# Patient Record
Sex: Female | Born: 1975 | Race: White | Hispanic: No | Marital: Married | Smoking: Former smoker
Health system: Southern US, Community
[De-identification: ages and names within clinical notes are randomized; demographics above are authoritative.]

## PROBLEM LIST (undated history)

## (undated) DIAGNOSIS — Z9889 Other specified postprocedural states: Secondary | ICD-10-CM

## (undated) DIAGNOSIS — C801 Malignant (primary) neoplasm, unspecified: Secondary | ICD-10-CM

## (undated) DIAGNOSIS — T8859XA Other complications of anesthesia, initial encounter: Secondary | ICD-10-CM

## (undated) DIAGNOSIS — K219 Gastro-esophageal reflux disease without esophagitis: Secondary | ICD-10-CM

## (undated) DIAGNOSIS — D649 Anemia, unspecified: Secondary | ICD-10-CM

## (undated) DIAGNOSIS — T4145XA Adverse effect of unspecified anesthetic, initial encounter: Secondary | ICD-10-CM

## (undated) DIAGNOSIS — J45909 Unspecified asthma, uncomplicated: Secondary | ICD-10-CM

## (undated) DIAGNOSIS — R112 Nausea with vomiting, unspecified: Secondary | ICD-10-CM

## (undated) DIAGNOSIS — Z5189 Encounter for other specified aftercare: Secondary | ICD-10-CM

## (undated) HISTORY — PX: ABDOMINAL HYSTERECTOMY: SHX81

## (undated) HISTORY — DX: Anemia, unspecified: D64.9

## (undated) HISTORY — DX: Encounter for other specified aftercare: Z51.89

## (undated) HISTORY — DX: Gastro-esophageal reflux disease without esophagitis: K21.9

## (undated) HISTORY — DX: Unspecified asthma, uncomplicated: J45.909

## (undated) HISTORY — DX: Malignant (primary) neoplasm, unspecified: C80.1

## (undated) HISTORY — PX: SIGMOIDOSCOPY: SUR1295

---

## 2011-01-17 ENCOUNTER — Encounter: Payer: Self-pay | Admitting: *Deleted

## 2011-01-17 ENCOUNTER — Emergency Department
Admission: EM | Admit: 2011-01-17 | Discharge: 2011-01-17 | Disposition: A | Discharge status: Home / Self Care | Payer: BC Managed Care – PPO | Source: Home / Self Care | Attending: Family Medicine | Admitting: Family Medicine

## 2011-01-17 DIAGNOSIS — J069 Acute upper respiratory infection, unspecified: Secondary | ICD-10-CM

## 2011-01-17 DIAGNOSIS — M940 Chondrocostal junction syndrome [Tietze]: Secondary | ICD-10-CM

## 2011-01-17 MED ORDER — PREDNISONE 10 MG PO TABS: ORAL_TABLET | ORAL | Status: AC

## 2011-01-17 MED ORDER — ALBUTEROL SULFATE HFA 108 (90 BASE) MCG/ACT IN AERS: 2.0000 | INHALATION_SPRAY | RESPIRATORY_TRACT | Status: AC | PRN

## 2011-01-17 MED ORDER — BENZONATATE 200 MG PO CAPS: 200.0000 mg | ORAL_CAPSULE | Freq: Every day | ORAL | Status: AC

## 2011-01-17 MED ORDER — AZITHROMYCIN 250 MG PO TABS: ORAL_TABLET | ORAL | Status: AC

## 2011-01-17 NOTE — ED Notes (Signed)
Patient c/o loss of voice about 5 days ago. Cough (productive at times), congestion and drainage started shortly after. Denies fever. Used tylenol and afrin OTC.

## 2011-01-19 NOTE — ED Provider Notes (Signed)
History     CSN: [id number] Arrival date & time: 01/17/2011 10:50 AM   First MD Initiated Contact with Patient 01/17/11 1122      Chief Complaint  Patient presents with  . Cough  . URI     HPI Comments: Patient complains of approximately 6 day history of gradually progressive URI symptoms beginning with a mild sore throat (now improved) and hoarseness, followed by progressive nasal congestion.  A cough started about 2 days ago.  Complains of fatigue and initial myalgias.  Cough is now worse at night and semi-productive during the day.  There has been no pleuritic pain or shortness of breath, but she wheezes at times. She has a past history of exercise asthma.  Patient is a 35 y.o. female presenting with cough and URI. The history is provided by the patient.  Cough This is a new problem. The current episode started more than 1 week ago. The problem occurs every few minutes. The problem has been gradually worsening. The cough is productive of sputum. There has been no fever. Associated symptoms include headaches, rhinorrhea, sore throat and wheezing. Pertinent negatives include no chest pain, no chills, no sweats, no ear congestion, no ear pain, no myalgias, no shortness of breath and no eye redness. She has tried decongestants for the symptoms. The treatment provided no relief. She is not a smoker. Her past medical history is significant for asthma. Her past medical history does not include pneumonia.  URI The primary symptoms include fatigue, headaches, sore throat, cough and wheezing. Primary symptoms do not include fever, ear pain or myalgias.  The patient's medical history is significant for asthma.  Symptoms associated with the illness include congestion and rhinorrhea. The illness is not associated with chills or sinus pressure.    History reviewed. No pertinent past medical history.  Past Surgical History  Procedure Date  . Cesarean section   . Abdominal hysterectomy      History reviewed. No pertinent family history.  History  Substance Use Topics  . Smoking status: Former Smoker    Quit date: 01/17/2008  . Smokeless tobacco: Not on file  . Alcohol Use: Yes     5/week    OB History    Grav Para Term Preterm Abortions TAB SAB Ect Mult Living                  Review of Systems  Constitutional: Positive for fatigue. Negative for fever and chills.  HENT: Positive for congestion, sore throat and rhinorrhea. Negative for ear pain and sinus pressure.   Eyes: Negative.  Negative for redness.  Respiratory: Positive for cough, chest tightness and wheezing. Negative for shortness of breath.   Cardiovascular: Negative for chest pain.  Gastrointestinal: Negative.   Genitourinary: Negative.   Musculoskeletal: Negative.  Negative for myalgias.  Skin: Negative.   Neurological: Positive for headaches.    Allergies  Penicillins and Sulfa antibiotics  Home Medications   Current Outpatient Rx  Name Route Sig Dispense Refill  . ALBUTEROL SULFATE HFA 108 (90 BASE) MCG/ACT IN AERS Inhalation Inhale 2 puffs into the lungs every 4 (four) hours as needed for wheezing. 1 Inhaler 0  . AZITHROMYCIN 250 MG PO TABS  Take 2 tabs today; then begin one tab once daily for 4 more days. (Rx void after 01/24/11)   DEA # [id number] each 0  . BENZONATATE 200 MG PO CAPS Oral Take 1 capsule (200 mg total) by mouth at bedtime. Take as needed for  cough 12 capsule 0  . PREDNISONE 10 MG PO TABS  Take 2 tabs twice daily for two days, then one tab twice daily for 2 days, then 1 daily for two days.  Take PC 14 tablet 0    BP 96/60  Pulse 69  Temp(Src) 98.5 F (36.9 C) (Oral)  Resp 14  Ht 5\' 3"  (1.6 m)  Wt 130 lb (58.968 kg)  BMI 23.03 kg/m2  SpO2 99%  Physical Exam  Nursing note and vitals reviewed. Constitutional: She is oriented to person, place, and time. She appears well-developed and well-nourished. No distress.  HENT:  Head: Normocephalic.  Right Ear: External  ear normal.  Left Ear: External ear normal.  Nose: Mucosal edema and rhinorrhea present. No sinus tenderness.  Mouth/Throat: Oropharynx is clear and moist. No oropharyngeal exudate.  Eyes: Conjunctivae and EOM are normal. Pupils are equal, round, and reactive to light. Right eye exhibits no discharge. Left eye exhibits no discharge.  Neck: Normal range of motion. Neck supple.  Cardiovascular: Normal rate, regular rhythm and normal heart sounds.   Pulmonary/Chest: Effort normal and breath sounds normal. She exhibits tenderness.       Distinct tenderness to palpation over sternum  Abdominal: Soft. Bowel sounds are normal. There is no tenderness.  Musculoskeletal: She exhibits no edema.  Lymphadenopathy:    She has cervical adenopathy.       Right cervical: Posterior cervical adenopathy present.       Left cervical: Posterior cervical adenopathy present.  Neurological: She is alert and oriented to person, place, and time.  Skin: Skin is warm and dry. She is not diaphoretic.    ED Course  Procedures none      1. Acute upper respiratory infections of unspecified site   2. Costochondritis, acute       MDM  There is no evidence of bacterial infection today.   Begin tapering course of prednisone.  Rx for albuterol inhaler as needed. Take Mucinex D (guaifenesin with decongestant) twice daily for congestion.  Increase fluid intake, rest. May use Afrin nasal spray (or generic oxymetazoline) twice daily for about 5 days.  Also recommend using saline nasal spray several times daily and/or saline nasal irrigation. Stop all antihistamines for now, and other non-prescription cough/cold preparations. May take Ibuprofen 200mg , 4 tabs every 8 hours with food for chest/sternum discomfort. Begin Azithromycin if not improving about 5 days or if persistent fever develops. Recommend flu shot when well Follow-up with family doctor if not improving 7 to 10 days.       Donna Christen,  MD 01/19/11 [phone]

## 2012-02-20 DIAGNOSIS — C801 Malignant (primary) neoplasm, unspecified: Secondary | ICD-10-CM

## 2012-02-20 HISTORY — DX: Malignant (primary) neoplasm, unspecified: C80.1

## 2012-02-20 HISTORY — PX: OTHER SURGICAL HISTORY: SHX169

## 2014-11-12 ENCOUNTER — Other Ambulatory Visit: Payer: Self-pay

## 2014-11-12 DIAGNOSIS — Z1231 Encounter for screening mammogram for malignant neoplasm of breast: Secondary | ICD-10-CM

## 2014-11-23 ENCOUNTER — Ambulatory Visit
Admission: RE | Admit: 2014-11-23 | Discharge: 2014-11-23 | Disposition: A | Discharge status: Home / Self Care | Payer: BLUE CROSS/BLUE SHIELD | Source: Ambulatory Visit

## 2014-11-23 DIAGNOSIS — Z1231 Encounter for screening mammogram for malignant neoplasm of breast: Secondary | ICD-10-CM

## 2014-11-25 ENCOUNTER — Other Ambulatory Visit: Payer: Self-pay | Admitting: Obstetrics and Gynecology

## 2014-11-25 DIAGNOSIS — R928 Other abnormal and inconclusive findings on diagnostic imaging of breast: Secondary | ICD-10-CM

## 2014-12-02 ENCOUNTER — Ambulatory Visit
Admission: RE | Admit: 2014-12-02 | Discharge: 2014-12-02 | Disposition: A | Discharge status: Home / Self Care | Payer: BLUE CROSS/BLUE SHIELD | Source: Ambulatory Visit | Attending: Obstetrics and Gynecology | Admitting: Obstetrics and Gynecology

## 2014-12-02 DIAGNOSIS — R928 Other abnormal and inconclusive findings on diagnostic imaging of breast: Secondary | ICD-10-CM

## 2015-07-21 DIAGNOSIS — K219 Gastro-esophageal reflux disease without esophagitis: Secondary | ICD-10-CM | POA: Diagnosis not present

## 2015-07-21 DIAGNOSIS — D539 Nutritional anemia, unspecified: Secondary | ICD-10-CM | POA: Diagnosis not present

## 2015-07-21 DIAGNOSIS — Z6824 Body mass index (BMI) 24.0-24.9, adult: Secondary | ICD-10-CM | POA: Diagnosis not present

## 2015-07-21 DIAGNOSIS — J45909 Unspecified asthma, uncomplicated: Secondary | ICD-10-CM | POA: Diagnosis not present

## 2015-10-11 DIAGNOSIS — Z6824 Body mass index (BMI) 24.0-24.9, adult: Secondary | ICD-10-CM | POA: Diagnosis not present

## 2015-10-11 DIAGNOSIS — B372 Candidiasis of skin and nail: Secondary | ICD-10-CM | POA: Diagnosis not present

## 2015-10-11 DIAGNOSIS — Z23 Encounter for immunization: Secondary | ICD-10-CM | POA: Diagnosis not present

## 2015-11-03 DIAGNOSIS — N898 Other specified noninflammatory disorders of vagina: Secondary | ICD-10-CM | POA: Diagnosis not present

## 2015-11-03 DIAGNOSIS — Z8544 Personal history of malignant neoplasm of other female genital organs: Secondary | ICD-10-CM | POA: Diagnosis not present

## 2015-11-03 DIAGNOSIS — C52 Malignant neoplasm of vagina: Secondary | ICD-10-CM | POA: Diagnosis not present

## 2015-11-03 DIAGNOSIS — Z87891 Personal history of nicotine dependence: Secondary | ICD-10-CM | POA: Diagnosis not present

## 2015-11-03 DIAGNOSIS — N959 Unspecified menopausal and perimenopausal disorder: Secondary | ICD-10-CM | POA: Diagnosis not present

## 2015-11-03 DIAGNOSIS — Z08 Encounter for follow-up examination after completed treatment for malignant neoplasm: Secondary | ICD-10-CM | POA: Diagnosis not present

## 2015-12-22 ENCOUNTER — Other Ambulatory Visit: Payer: Self-pay | Admitting: Internal Medicine

## 2015-12-22 DIAGNOSIS — Z1231 Encounter for screening mammogram for malignant neoplasm of breast: Secondary | ICD-10-CM

## 2016-01-24 ENCOUNTER — Ambulatory Visit
Admission: RE | Admit: 2016-01-24 | Discharge: 2016-01-24 | Disposition: A | Discharge status: Home / Self Care | Payer: BLUE CROSS/BLUE SHIELD | Source: Ambulatory Visit | Attending: Internal Medicine | Admitting: Internal Medicine

## 2016-01-24 DIAGNOSIS — Z1231 Encounter for screening mammogram for malignant neoplasm of breast: Secondary | ICD-10-CM | POA: Diagnosis not present

## 2016-01-30 DIAGNOSIS — R8299 Other abnormal findings in urine: Secondary | ICD-10-CM | POA: Diagnosis not present

## 2016-01-30 DIAGNOSIS — D538 Other specified nutritional anemias: Secondary | ICD-10-CM | POA: Diagnosis not present

## 2016-01-30 DIAGNOSIS — Z0000 Encounter for general adult medical examination without abnormal findings: Secondary | ICD-10-CM | POA: Diagnosis not present

## 2016-02-07 DIAGNOSIS — D538 Other specified nutritional anemias: Secondary | ICD-10-CM | POA: Diagnosis not present

## 2016-02-07 DIAGNOSIS — Z7189 Other specified counseling: Secondary | ICD-10-CM | POA: Diagnosis not present

## 2016-02-07 DIAGNOSIS — Z0000 Encounter for general adult medical examination without abnormal findings: Secondary | ICD-10-CM | POA: Diagnosis not present

## 2016-02-07 DIAGNOSIS — Z23 Encounter for immunization: Secondary | ICD-10-CM | POA: Diagnosis not present

## 2016-02-07 DIAGNOSIS — Z1389 Encounter for screening for other disorder: Secondary | ICD-10-CM | POA: Diagnosis not present

## 2016-02-07 DIAGNOSIS — J45909 Unspecified asthma, uncomplicated: Secondary | ICD-10-CM | POA: Diagnosis not present

## 2016-02-07 DIAGNOSIS — K219 Gastro-esophageal reflux disease without esophagitis: Secondary | ICD-10-CM | POA: Diagnosis not present

## 2016-02-08 DIAGNOSIS — D538 Other specified nutritional anemias: Secondary | ICD-10-CM | POA: Diagnosis not present

## 2016-03-16 DIAGNOSIS — J101 Influenza due to other identified influenza virus with other respiratory manifestations: Secondary | ICD-10-CM | POA: Diagnosis not present

## 2016-04-09 ENCOUNTER — Encounter: Payer: Self-pay | Admitting: Gastroenterology

## 2016-05-10 DIAGNOSIS — Z87891 Personal history of nicotine dependence: Secondary | ICD-10-CM | POA: Diagnosis not present

## 2016-05-10 DIAGNOSIS — Z90710 Acquired absence of both cervix and uterus: Secondary | ICD-10-CM | POA: Diagnosis not present

## 2016-05-10 DIAGNOSIS — Z08 Encounter for follow-up examination after completed treatment for malignant neoplasm: Secondary | ICD-10-CM | POA: Diagnosis not present

## 2016-05-10 DIAGNOSIS — C52 Malignant neoplasm of vagina: Secondary | ICD-10-CM | POA: Diagnosis not present

## 2016-05-10 DIAGNOSIS — Z8544 Personal history of malignant neoplasm of other female genital organs: Secondary | ICD-10-CM | POA: Diagnosis not present

## 2016-05-14 ENCOUNTER — Ambulatory Visit (INDEPENDENT_AMBULATORY_CARE_PROVIDER_SITE_OTHER): Payer: BLUE CROSS/BLUE SHIELD | Admitting: Gastroenterology

## 2016-05-14 ENCOUNTER — Encounter (INDEPENDENT_AMBULATORY_CARE_PROVIDER_SITE_OTHER): Payer: Self-pay

## 2016-05-14 ENCOUNTER — Encounter: Payer: Self-pay | Admitting: Gastroenterology

## 2016-05-14 VITALS — BP 100/66 | HR 76 | Ht 63.0 in | Wt 137.2 lb

## 2016-05-14 DIAGNOSIS — K625 Hemorrhage of anus and rectum: Secondary | ICD-10-CM | POA: Diagnosis not present

## 2016-05-14 NOTE — Progress Notes (Signed)
HPI: This is a  very pleasant 41 year old woman  who was referred to me by Marton Redwood, MD  to evaluate  rectal bleeding, lower abdominal cramping .    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  Radiation and chemo.  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 See below.  Has also seen Dr. Michail Sermon at [place].  Things were were getting better for past 2  Years.  Then bleeding, cramping, bloating.  Avoids dairy.  For about 2 months bleeding and cramping.  Cramping is several days per week.  Bleeding a few days per week.  Does not  have to push or strain at all to move her bowels. She does have a lot of bloating at times. She's had no fevers or chills. No significant abdominal pains at all. Her weight has been overall stable  Takes one MVI once daily.    Old Data Reviewed: Cut/Pasted from [facility] GI note 2015: Had radiation therapy in January 2015 Onset of symptoms was Sept 2015. Most bothered by tenesmus. Denies straining, need for splinting, or prolapse. Previously had nightime symptoms-not now. Pain is in rectum, intense dull ache that lingers after bowel movements, lingers for hours afterward. Then self-limited. Tramadol has helped in the past. Motrin also helped, but she stopped this due to proctitis. Flex sig in 10/15 [facility] rectal mucosal ulceration, biopsies benign. Used canasa-no relief after 10 days. Also used cortenemas Also without relief. Colonoscopy 12/15 [facility] showed no change in mucosal abnormalities.   Review of systems: Pertinent positive and negative review of systems were noted in the above HPI section. Complete review of systems was performed and was otherwise normal.   History reviewed. No pertinent past medical history.  Past Surgical History:  Procedure Laterality Date  . ABDOMINAL HYSTERECTOMY    . CESAREAN  SECTION      Current Outpatient Prescriptions  Medication Sig Dispense Refill  . Multiple Vitamin (MULTI-VITAMINS) TABS Take by mouth.    . pantoprazole (PROTONIX) 40 MG tablet Take 1 tablet by mouth daily.  6  . PREMARIN 0.625 MG tablet Take 1 tablet by mouth daily.  3  . albuterol (PROVENTIL HFA;VENTOLIN HFA) 108 (90 BASE) MCG/ACT inhaler Inhale 2 puffs into the lungs every 4 (four) hours as needed for wheezing. 1 Inhaler 0   No current facility-administered medications for this visit.     Allergies as of 05/14/2016 - Review Complete 05/14/2016  Allergen Reaction Noted  . Penicillins  01/17/2011  . Sulfa antibiotics  01/17/2011    History reviewed. No pertinent family history.  Social History   Social History  . Marital status: Married    Spouse name: N/A  . Number of children: 3  . Years of education: N/A   Occupational History  . Not on file.   Social History Main Topics  . Smoking status: Former Smoker    Quit date: 01/17/2008  . Smokeless tobacco: Never Used  . Alcohol use Yes     Comment: 5/week  . Drug use: No  . Sexual activity: Not on file   Other Topics Concern  . Not on file   Social History Narrative  . No narrative on file     Physical Exam: BP 100/66   Pulse 76   Ht 5\' 3"  (1.6 m)   Wt 137 lb 3.2 oz (62.2 kg)   BMI 24.30  kg/m  Constitutional: generally well-appearing Psychiatric: alert and oriented x3 Eyes: extraocular movements intact Mouth: oral pharynx moist, no lesions Neck: supple no lymphadenopathy Cardiovascular: heart regular rate and rhythm Lungs: clear to auscultation bilaterally Abdomen: soft, nontender, nondistended, no obvious ascites, no peritoneal signs, normal bowel sounds Extremities: no lower extremity edema bilaterally Skin: no lesions on visible extremities   Assessment and plan: 41 y.o. 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 I recommended we  try to prove chronic radiation damage with flexible sigmoidoscopy at her soonest convenience. Management if she does have radiation related proctitis is symptomatic control. She is really not very bothered by her symptoms and she said if we exclude other potential causes and she probably would opt for no treatment at this point. I think it would just be fine and we'll proceed with flexible sigmoidoscopy.    Please see the "Patient Instructions" section for addition details about the plan.   Owens Loffler, MD [facility] 05/14/2016, 8:36 AM  Cc: Marton Redwood, MD

## 2016-05-14 NOTE — Patient Instructions (Addendum)
Flexible sigmoidoscopy for rectal bleeding, cramping; restaging of previously known radiation proctitis.

## 2016-06-09 DIAGNOSIS — M545 Low back pain: Secondary | ICD-10-CM | POA: Diagnosis not present

## 2016-06-09 DIAGNOSIS — J069 Acute upper respiratory infection, unspecified: Secondary | ICD-10-CM | POA: Diagnosis not present

## 2016-06-09 DIAGNOSIS — R300 Dysuria: Secondary | ICD-10-CM | POA: Diagnosis not present

## 2016-06-27 ENCOUNTER — Encounter: Payer: Self-pay | Admitting: Gastroenterology

## 2016-07-11 ENCOUNTER — Other Ambulatory Visit: Payer: Self-pay

## 2016-07-11 ENCOUNTER — Ambulatory Visit (AMBULATORY_SURGERY_CENTER): Payer: BLUE CROSS/BLUE SHIELD | Admitting: Gastroenterology

## 2016-07-11 ENCOUNTER — Encounter: Payer: Self-pay | Admitting: Gastroenterology

## 2016-07-11 ENCOUNTER — Telehealth: Payer: Self-pay

## 2016-07-11 VITALS — BP 90/60 | HR 55 | Temp 98.9°F | Resp 10 | Ht 63.0 in | Wt 137.0 lb

## 2016-07-11 DIAGNOSIS — K627 Radiation proctitis: Secondary | ICD-10-CM

## 2016-07-11 DIAGNOSIS — K625 Hemorrhage of anus and rectum: Secondary | ICD-10-CM

## 2016-07-11 MED ORDER — SODIUM CHLORIDE 0.9 % IV SOLN: 500.0000 mL | INTRAVENOUS | Status: DC

## 2016-07-11 NOTE — Telephone Encounter (Signed)
Pt has been scheduled for 07/19/16 1015 am [facility] Left message on machine to call back

## 2016-07-11 NOTE — Telephone Encounter (Signed)
-----   Message from Milus Banister, MD sent at 07/11/2016  2:01 PM EDT ----- She would like to go ahead with flex sig APC for radiation proctitis at [facility] (next Thursday or the one after).  Thanks  dj

## 2016-07-11 NOTE — Op Note (Signed)
[name] Procedure Date: 07/11/2016 1:40 PM MRN: [id number] Endoscopist: Milus Banister , MD Age: 41 Referring MD:  Date of Birth: 04/28/1975 Gender: Female Account #: [id number] Procedure:                Flexible Sigmoidoscopy Indications:              Hematochezia; h/o pelvic radiation 2015 for vaginal                            cancer Medicines:                Monitored Anesthesia Care Procedure:                Pre-Anesthesia Assessment:                           - Prior to the procedure, a History and Physical                            was performed, and patient medications and                            allergies were reviewed. The patient's tolerance of                            previous anesthesia was also reviewed. The risks                            and benefits of the procedure and the sedation                            options and risks were discussed with the patient.                            All questions were answered, and informed consent                            was obtained. Prior Anticoagulants: The patient has                            taken no previous anticoagulant or antiplatelet                            agents. ASA Grade Assessment: II - A patient with                            mild systemic disease. After reviewing the risks                            and benefits, the patient was deemed in                            satisfactory condition to undergo the procedure.  After obtaining informed consent, the scope was                            passed under direct vision. The Colonoscope was                            introduced through the anus and advanced to the the                            splenic flexure. The flexible sigmoidoscopy was                            accomplished without difficulty. The patient                            tolerated the procedure well. The quality of the                             bowel preparation was good. 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                           The exam was otherwise without abnormality. Complications:            No immediate complications. Estimated blood loss:                            None. Estimated Blood Loss:     Estimated blood loss: none. 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 These are the source of your                            intermittent bleeding and they would be amenable to                            APC (cautery) treatment if you are ever interested. Recommendation:           - Discharge patient to home (ambulatory). Milus Banister, MD 07/11/2016 1:48:55 PM This report has been signed electronically.

## 2016-07-11 NOTE — Progress Notes (Signed)
Report to PACU, RN, vss, BBS= Clear.  

## 2016-07-11 NOTE — Patient Instructions (Addendum)
   YOU HAD AN ENDOSCOPIC PROCEDURE TODAY AT [facility]:   Refer to the procedure report that was given to you for any specific questions about what was found during the examination.  If the procedure report does not answer your questions, please call your gastroenterologist to clarify.  If you requested that your care partner not be given the details of your procedure findings, then the procedure report has been included in a sealed envelope for you to review at your convenience later.  YOU SHOULD EXPECT: Some feelings of bloating in the abdomen. Passage of more gas than usual.  Walking can help get rid of the air that was put into your GI tract during the procedure and reduce the bloating. If you had a lower endoscopy (such as a colonoscopy or flexible sigmoidoscopy) you may notice spotting of blood in your stool or on the toilet paper. If you underwent a bowel prep for your procedure, you may not have a normal bowel movement for a few days.  Please Note:  You might notice some irritation and congestion in your nose or some drainage.  This is from the oxygen used during your procedure.  There is no need for concern and it should clear up in a day or so.  SYMPTOMS TO REPORT IMMEDIATELY:   Following lower endoscopy (colonoscopy or flexible sigmoidoscopy):  Excessive amounts of blood in the stool  Significant tenderness or worsening of abdominal pains  Swelling of the abdomen that is new, acute  Fever of 100F or higher   For urgent or emergent issues, a gastroenterologist can be reached at any hour by calling [phone].   DIET:  We do recommend a small meal at first, but then you may proceed to your regular diet.  Drink plenty of fluids but you should avoid alcoholic beverages for 24 hours.  ACTIVITY:  You should plan to take it easy for the rest of today and you should NOT DRIVE or use heavy machinery until tomorrow (because of the sedation medicines used during the  test).    FOLLOW UP: Our staff will call the number listed on your records the next business day following your procedure to check on you and address any questions or concerns that you may have regarding the information given to you following your procedure. If we do not reach you, we will leave a message.  However, if you are feeling well and you are not experiencing any problems, there is no need to return our call.  We will assume that you have returned to your regular daily activities without incident.  If any biopsies were taken you will be contacted by phone or by letter within the next 1-3 weeks.  Please call us at [phone] if you have not heard about the biopsies in 3 weeks.    SIGNATURES/CONFIDENTIALITY: You and/or your care partner have signed paperwork which will be entered into your electronic medical record.  These signatures attest to the fact that that the information above on your After Visit Summary has been reviewed and is understood.  Full responsibility of the confidentiality of this discharge information lies with you and/or your care-partner.  Read all information given to you by your recovery room nurse.  The CMA from the 3rd floor will call you to schedule your other procedure.

## 2016-07-12 ENCOUNTER — Other Ambulatory Visit: Payer: Self-pay

## 2016-07-12 ENCOUNTER — Telehealth: Payer: Self-pay | Admitting: *Deleted

## 2016-07-12 NOTE — Telephone Encounter (Signed)
Left message on machine to call back  

## 2016-07-12 NOTE — Telephone Encounter (Signed)
Pt request appt to be moved to 08/02/16 730 am New instructions have been sent to the pt via My Chart

## 2016-07-12 NOTE — Telephone Encounter (Signed)
Flex scheduled, pt instructed and medications reviewed.  Patient instructions mailed to home.  Patient to call with any questions or concerns.  

## 2016-07-12 NOTE — Telephone Encounter (Signed)
  Follow up Call-  Call back number 07/11/2016  Post procedure Call Back phone  # [phone]  Permission to leave phone message Yes  Some recent data might be hidden     Patient questions:  Do you have a fever, pain , or abdominal swelling? No. Pain Score  0 *  Have you tolerated food without any problems? Yes.    Have you been able to return to your normal activities? Yes.    Do you have any questions about your discharge instructions: Diet   No. Medications  No. Follow up visit  No.  Do you have questions or concerns about your Care? No.  Actions: * If pain score is 4 or above: No action needed, pain <4.

## 2016-07-26 ENCOUNTER — Telehealth: Payer: Self-pay | Admitting: Gastroenterology

## 2016-07-26 NOTE — Telephone Encounter (Signed)
Patient's flex sig is cancelled with central scheduling.

## 2016-07-27 NOTE — Telephone Encounter (Signed)
Dr Ardis Hughs the pt cancelled the Flex and states she will call back to reschedule

## 2016-08-02 ENCOUNTER — Ambulatory Visit (HOSPITAL_COMMUNITY): Admit: 2016-08-02 | Payer: BLUE CROSS/BLUE SHIELD | Admitting: Gastroenterology

## 2016-08-02 ENCOUNTER — Encounter (HOSPITAL_COMMUNITY): Payer: Self-pay

## 2016-08-02 SURGERY — SIGMOIDOSCOPY, FLEXIBLE
Anesthesia: Moderate Sedation

## 2016-08-13 DIAGNOSIS — Z23 Encounter for immunization: Secondary | ICD-10-CM | POA: Diagnosis not present

## 2016-11-08 DIAGNOSIS — Z23 Encounter for immunization: Secondary | ICD-10-CM | POA: Diagnosis not present

## 2016-11-15 DIAGNOSIS — Z8544 Personal history of malignant neoplasm of other female genital organs: Secondary | ICD-10-CM | POA: Diagnosis not present

## 2016-11-15 DIAGNOSIS — Z86008 Personal history of in-situ neoplasm of other site: Secondary | ICD-10-CM | POA: Diagnosis not present

## 2016-11-15 DIAGNOSIS — Z09 Encounter for follow-up examination after completed treatment for conditions other than malignant neoplasm: Secondary | ICD-10-CM | POA: Diagnosis not present

## 2016-11-15 DIAGNOSIS — N393 Stress incontinence (female) (male): Secondary | ICD-10-CM | POA: Diagnosis not present

## 2016-11-15 DIAGNOSIS — C539 Malignant neoplasm of cervix uteri, unspecified: Secondary | ICD-10-CM | POA: Diagnosis not present

## 2016-11-19 ENCOUNTER — Other Ambulatory Visit: Payer: Self-pay | Admitting: Internal Medicine

## 2016-11-19 DIAGNOSIS — R52 Pain, unspecified: Secondary | ICD-10-CM | POA: Diagnosis not present

## 2016-11-19 DIAGNOSIS — R300 Dysuria: Secondary | ICD-10-CM | POA: Diagnosis not present

## 2016-11-19 DIAGNOSIS — Z6823 Body mass index (BMI) 23.0-23.9, adult: Secondary | ICD-10-CM | POA: Diagnosis not present

## 2016-11-19 DIAGNOSIS — N390 Urinary tract infection, site not specified: Secondary | ICD-10-CM | POA: Diagnosis not present

## 2016-11-19 DIAGNOSIS — R1030 Lower abdominal pain, unspecified: Secondary | ICD-10-CM | POA: Diagnosis not present

## 2016-11-19 DIAGNOSIS — Z1231 Encounter for screening mammogram for malignant neoplasm of breast: Secondary | ICD-10-CM

## 2016-12-11 DIAGNOSIS — R300 Dysuria: Secondary | ICD-10-CM | POA: Diagnosis not present

## 2016-12-11 DIAGNOSIS — R102 Pelvic and perineal pain: Secondary | ICD-10-CM | POA: Diagnosis not present

## 2016-12-18 DIAGNOSIS — N393 Stress incontinence (female) (male): Secondary | ICD-10-CM | POA: Diagnosis not present

## 2016-12-18 DIAGNOSIS — N8184 Pelvic muscle wasting: Secondary | ICD-10-CM | POA: Diagnosis not present

## 2017-01-15 DIAGNOSIS — R319 Hematuria, unspecified: Secondary | ICD-10-CM | POA: Diagnosis not present

## 2017-01-25 ENCOUNTER — Ambulatory Visit
Admission: RE | Admit: 2017-01-25 | Discharge: 2017-01-25 | Disposition: A | Discharge status: Home / Self Care | Payer: BLUE CROSS/BLUE SHIELD | Source: Ambulatory Visit | Attending: Internal Medicine | Admitting: Internal Medicine

## 2017-01-25 DIAGNOSIS — Z1231 Encounter for screening mammogram for malignant neoplasm of breast: Secondary | ICD-10-CM

## 2017-02-06 DIAGNOSIS — N8184 Pelvic muscle wasting: Secondary | ICD-10-CM | POA: Diagnosis not present

## 2017-02-06 DIAGNOSIS — N393 Stress incontinence (female) (male): Secondary | ICD-10-CM | POA: Diagnosis not present

## 2017-02-20 DIAGNOSIS — N393 Stress incontinence (female) (male): Secondary | ICD-10-CM | POA: Diagnosis not present

## 2017-02-20 DIAGNOSIS — N8184 Pelvic muscle wasting: Secondary | ICD-10-CM | POA: Diagnosis not present

## 2017-05-09 DIAGNOSIS — C52 Malignant neoplasm of vagina: Secondary | ICD-10-CM | POA: Diagnosis not present

## 2017-05-09 DIAGNOSIS — Z08 Encounter for follow-up examination after completed treatment for malignant neoplasm: Secondary | ICD-10-CM | POA: Diagnosis not present

## 2017-05-09 DIAGNOSIS — N939 Abnormal uterine and vaginal bleeding, unspecified: Secondary | ICD-10-CM | POA: Diagnosis not present

## 2017-05-09 DIAGNOSIS — Z8742 Personal history of other diseases of the female genital tract: Secondary | ICD-10-CM | POA: Diagnosis not present

## 2017-05-09 DIAGNOSIS — Z8544 Personal history of malignant neoplasm of other female genital organs: Secondary | ICD-10-CM | POA: Diagnosis not present

## 2017-06-26 DIAGNOSIS — Z23 Encounter for immunization: Secondary | ICD-10-CM | POA: Diagnosis not present

## 2017-11-21 ENCOUNTER — Other Ambulatory Visit (INDEPENDENT_AMBULATORY_CARE_PROVIDER_SITE_OTHER): Payer: PRIVATE HEALTH INSURANCE

## 2017-11-21 ENCOUNTER — Encounter: Payer: Self-pay | Admitting: Physician Assistant

## 2017-11-21 ENCOUNTER — Ambulatory Visit (INDEPENDENT_AMBULATORY_CARE_PROVIDER_SITE_OTHER): Payer: PRIVATE HEALTH INSURANCE | Admitting: Physician Assistant

## 2017-11-21 ENCOUNTER — Telehealth: Payer: Self-pay | Admitting: *Deleted

## 2017-11-21 VITALS — BP 100/62 | HR 68 | Ht 63.0 in | Wt 142.4 lb

## 2017-11-21 DIAGNOSIS — K627 Radiation proctitis: Secondary | ICD-10-CM

## 2017-11-21 DIAGNOSIS — R1084 Generalized abdominal pain: Secondary | ICD-10-CM

## 2017-11-21 DIAGNOSIS — C52 Malignant neoplasm of vagina: Secondary | ICD-10-CM | POA: Diagnosis not present

## 2017-11-21 LAB — BASIC METABOLIC PANEL
BUN: 15 mg/dL (ref 6–23)
CALCIUM: 8.9 mg/dL (ref 8.4–10.5)
CO2: 31 meq/L (ref 19–32)
Chloride: 103 mEq/L (ref 96–112)
Creatinine, Ser: 1.23 mg/dL — ABNORMAL HIGH (ref 0.40–1.20)
GFR: 50.63 mL/min — AB (ref >60.00)
Glucose, Bld: 86 mg/dL (ref 70–99)
Potassium: 4.1 mEq/L (ref 3.5–5.1)
SODIUM: 139 meq/L (ref 135–145)

## 2017-11-21 LAB — CBC WITH DIFFERENTIAL/PLATELET
BASOS ABS: 0 10*3/uL (ref 0.0–0.1)
BASOS PCT: 0.8 % (ref 0.0–3.0)
EOS PCT: 3.3 % (ref 0.0–5.0)
Eosinophils Absolute: 0.1 10*3/uL (ref 0.0–0.7)
HEMATOCRIT: 36.7 % (ref 36.0–46.0)
Hemoglobin: 12.9 g/dL (ref 12.0–15.0)
LYMPHS PCT: 30.8 % (ref 12.0–46.0)
Lymphs Abs: 1.3 10*3/uL (ref 0.7–4.0)
MCHC: 35.2 g/dL (ref 30.0–36.0)
MCV: 91.3 fl (ref 78.0–100.0)
MONOS PCT: 5.4 % (ref 3.0–12.0)
Monocytes Absolute: 0.2 10*3/uL (ref 0.1–1.0)
Neutro Abs: 2.5 10*3/uL (ref 1.4–7.7)
Neutrophils Relative %: 59.7 % (ref 43.0–77.0)
PLATELETS: 225 10*3/uL (ref 150.0–400.0)
RBC: 4.02 Mil/uL (ref 3.87–5.11)
RDW: 12.4 % (ref 11.5–15.5)
WBC: 4.2 10*3/uL (ref 4.0–10.5)

## 2017-11-21 MED ORDER — PEG 3350-KCL-NA BICARB-NACL 420 G PO SOLR: ORAL | 0 refills | Status: AC

## 2017-11-21 NOTE — Patient Instructions (Signed)
Your provider has requested that you go to the basement level for lab work before leaving today. Press "B" on the elevator. The lab is located at the first door on the left as you exit the elevator. You have been scheduled for a CT scan of the abdomen and pelvis at [place] ([address]---this is in the same building as Press [facility]).   You are scheduled on 12-03-2017 at 8:30 am. You should arrive 15 minutes prior to your appointment time for registration. Please follow the written instructions below on the day of your exam:  WARNING: IF YOU ARE ALLERGIC TO IODINE/X-RAY DYE, PLEASE NOTIFY RADIOLOGY IMMEDIATELY AT [phone]! YOU WILL BE GIVEN A 13 HOUR PREMEDICATION PREP.  1) Do not eat  anything after 4:30 am (4 hours prior to your test) 2) You have been given 2 bottles of oral contrast to drink. The solution may taste better if refrigerated, but do NOT add ice or any other liquid to this solution. Shake well before drinking.    Drink 1 bottle of contrast @ 6:30 am (2 hours prior to your exam)  Drink 1 bottle of contrast @ 7;30 am (1 hour prior to your exam)  You may take any medications as prescribed with a small amount of water, if necessary. If you take any of the following medications: METFORMIN, GLUCOPHAGE, GLUCOVANCE, AVANDAMET, RIOMET, FORTAMET, [place] MET, JANUMET, GLUMETZA or METAGLIP, you MAY be asked to HOLD this medication 48 hours AFTER the exam.  The purpose of you drinking the oral contrast is to aid in the visualization of your intestinal tract. The contrast solution may cause some diarrhea. Depending on your individual set of symptoms, you may also receive an intravenous injection of x-ray contrast/dye. Plan on being at [facility] for 30 minutes or longer, depending on the type of exam you are having performed.  This test typically takes 30-45 minutes to complete.  If you have any questions regarding your exam or if you need to reschedule,  you may call the CT department at [phone] between the hours of 8:00 am and 5:00 pm, Monday-Friday.  You have been scheduled for a colonoscopy. Please follow written instructions given to you at your visit today.  Please pick up your prep supplies at the pharmacy within the next 1-3 days. If you use inhalers (even only as needed), please bring them with you on the day of your procedure. ________________________________________________________________________

## 2017-11-21 NOTE — Progress Notes (Signed)
Subjective:    Patient ID: Stacy Perez, female    DOB: 01-23-1976, 42 y.o.   MRN: [id number]  HPI Amory is a pleasant 42 year old white female known to Dr. Ardis Hughs who was last seen in the office in March 2018.  She has history of vaginal cancer diagnosed in 2015 and is status post radiation and chemo as well as hysterectomy. 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  She  did not have any treatment at that time  She comes in today stating that she has continued to have rectal bleeding off and on over the past year and a half somewhat worse over the past couple of months usually with bright red blood seen with bowel movement on the tissue and sometimes on the stool.  She denies any rectal pain, no diarrhea or melena. She had recent GYN oncology follow-up and was told that she has a vaginal ulcer felt to be radiation related which they are monitoring. She says also over the past couple of months she has had some mid abdominal crampy pain which is new for her again intermittently and then intermittent loose stools appetite has been good Weight has been stable.  She does take omeprazole for GERD. She is concerned because of the new development of mid abdominal pain and cramping.  Review of Systems Pertinent positive and negative review of systems were noted in the above HPI section.  All other review of systems was otherwise negative.  Outpatient Encounter Medications as of 11/21/2017  Medication Sig  . Multiple Vitamin (MULTI-VITAMINS) TABS Take by mouth.  Marland Kitchen omeprazole (PRILOSEC) 20 MG capsule Take 20 mg by mouth daily.  . pentoxifylline (TRENTAL) 400 MG CR tablet Take 400 mg by mouth 3 (three) times daily with meals.  Marland Kitchen PREMARIN 0.625 MG tablet Take 1 tablet by mouth daily.  . [DISCONTINUED] pantoprazole (PROTONIX) 40 MG tablet Take 1  tablet by mouth daily.  Marland Kitchen albuterol (PROVENTIL HFA;VENTOLIN HFA) 108 (90 BASE) MCG/ACT inhaler Inhale 2 puffs into the lungs every 4 (four) hours as needed for wheezing.  . polyethylene glycol-electrolytes (NULYTELY/GOLYTELY) 420 g solution Take as directed for colonoscopy.  . [DISCONTINUED] 0.9 %  sodium chloride infusion    No facility-administered encounter medications on file as of 11/21/2017.    Allergies  Allergen Reactions  . Penicillins   . Sulfa Antibiotics    There are no active problems to display for this patient.  Social History   Socioeconomic History  . Marital status: Married    Spouse name: Not on file  . Number of children: 3  . Years of education: Not on file  . Highest education level: Not on file  Occupational History  . Not on file  Social Needs  . Financial resource strain: Not on file  . Food insecurity:    Worry: Not on file    Inability: Not on file  . Transportation needs:    Medical: Not on file    Non-medical: Not on file  Tobacco Use  . Smoking status: Former Smoker    Last attempt to quit: 01/17/2008    Years since quitting: 9.8  . Smokeless tobacco: Never Used  Substance and Sexual Activity  . Alcohol use: Yes    Comment: 5/week  . Drug use: No  . Sexual activity: Not on file  Lifestyle  . Physical  activity:    Days per week: Not on file    Minutes per session: Not on file  . Stress: Not on file  Relationships  . Social connections:    Talks on phone: Not on file    Gets together: Not on file    Attends religious service: Not on file    Active member of club or organization: Not on file    Attends meetings of clubs or organizations: Not on file    Relationship status: Not on file  . Intimate partner violence:    Fear of current or ex partner: Not on file    Emotionally abused: Not on file    Physically abused: Not on file    Forced sexual activity: Not on file  Other Topics Concern  . Not on file  Social History Narrative    . Not on file    Ms. Crace's family history includes Colon polyps in her mother.      Objective:    Vitals:   11/21/17 0942  BP: 100/62  Pulse: 68    Physical Exam; well-developed white female in no acute distress, pleasant blood pressure 100/62 pulse 68, BMI 25.2.  HEENT; nontraumatic normocephalic EOMI PERRLA sclera anicteric oral mucosa moist, Cardiovascular ;regular rate and rhythm with S1-S2 no murmur rub or gallop, Pulmonary ;clear bilaterally, Abdomen ;soft, she has some tenderness generally in the mid abdomen no palpable mass or hepatosplenomegaly no guarding or rebound bowel sounds are present, Rectal; exam not done today, Extremities; no clubbing cyanosis or edema skin warm dry, Neuro psych ;alert and oriented, grossly nonfocal mood and affect appropriate       Assessment & Plan:   #68 42 year old white female with history of vaginal cancer in 2015 post chemotherapy and radiation. Patient has previously documented radiation proctitis and continues to have intermittent rectal bleeding which has increased somewhat over the past couple of months. #2 new vaginal ulcer felt to be radiation related-being monitored by GYN oncology  #3   2 to 36-monthhistory of mid abdominal crampy discomfort new/etiology not clear no recent imaging #4  GERD-stable  Plan; patient will be scheduled for colon[facility] with plans for APC meant to the radiation proctitis.  Procedure was discussed in detail with the patient including indications risks and benefits and she is agreeable to proceed  We will also schedule for CT of the abdomen and pelvis with contrast.  Check CBC and be met today.    Jasamine Pottinger S Yocelin Vanlue PA-C 11/21/2017   Cc: SMarton Redwood MD

## 2017-11-21 NOTE — H&P (View-Only) (Signed)
Subjective:    Patient ID: Stacy Perez, female    DOB: [birth year], 42 y.o.   MRN: [id number]  HPI Stacy Perez is a pleasant 42 year old white female known to Dr. Ardis Perez who was last seen in the office in March 2018.  She has history of vaginal cancer diagnosed in 2015 and is status post radiation and chemo as well as hysterectomy. 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  She  did not have any treatment at that time  She comes in today stating that she has continued to have rectal bleeding off and on over the past year and a half somewhat worse over the past couple of months usually with bright red blood seen with bowel movement on the tissue and sometimes on the stool.  She denies any rectal pain, no diarrhea or melena. She had recent GYN oncology follow-up and was told that she has a vaginal ulcer felt to be radiation related which they are monitoring. She says also over the past couple of months she has had some mid abdominal crampy pain which is new for her again intermittently and then intermittent loose stools appetite has been good Weight has been stable.  She does take omeprazole for GERD. She is concerned because of the new development of mid abdominal pain and cramping.  Review of Systems Pertinent positive and negative review of systems were noted in the above HPI section.  All other review of systems was otherwise negative.  Outpatient Encounter Medications as of 11/21/2017  Medication Sig  . Multiple Vitamin (MULTI-VITAMINS) TABS Take by mouth.  Marland Kitchen omeprazole (PRILOSEC) 20 MG capsule Take 20 mg by mouth daily.  . pentoxifylline (TRENTAL) 400 MG CR tablet Take 400 mg by mouth 3 (three) times daily with meals.  Marland Kitchen PREMARIN 0.625 MG tablet Take 1 tablet by mouth daily.  . [DISCONTINUED] pantoprazole (PROTONIX) 40 MG tablet Take 1  tablet by mouth daily.  Marland Kitchen albuterol (PROVENTIL HFA;VENTOLIN HFA) 108 (90 BASE) MCG/ACT inhaler Inhale 2 puffs into the lungs every 4 (four) hours as needed for wheezing.  . polyethylene glycol-electrolytes (NULYTELY/GOLYTELY) 420 g solution Take as directed for colonoscopy.  . [DISCONTINUED] 0.9 %  sodium chloride infusion    No facility-administered encounter medications on file as of 11/21/2017.    Allergies  Allergen Reactions  . Penicillins   . Sulfa Antibiotics    There are no active problems to display for this patient.  Social History   Socioeconomic History  . Marital status: Married    Spouse name: Not on file  . Number of children: 3  . Years of education: Not on file  . Highest education level: Not on file  Occupational History  . Not on file  Social Needs  . Financial resource strain: Not on file  . Food insecurity:    Worry: Not on file    Inability: Not on file  . Transportation needs:    Medical: Not on file    Non-medical: Not on file  Tobacco Use  . Smoking status: Former Smoker    Last attempt to quit: 01/17/2008    Years since quitting: 9.8  . Smokeless tobacco: Never Used  Substance and Sexual Activity  . Alcohol use: Yes    Comment: 5/week  . Drug use: No  . Sexual activity: Not on file  Lifestyle  . Physical  activity:    Days per week: Not on file    Minutes per session: Not on file  . Stress: Not on file  Relationships  . Social connections:    Talks on phone: Not on file    Gets together: Not on file    Attends religious service: Not on file    Active member of club or organization: Not on file    Attends meetings of clubs or organizations: Not on file    Relationship status: Not on file  . Intimate partner violence:    Fear of current or ex partner: Not on file    Emotionally abused: Not on file    Physically abused: Not on file    Forced sexual activity: Not on file  Other Topics Concern  . Not on file  Social History Narrative    . Not on file    Ms. Stacy Perez's family history includes Colon polyps in her mother.      Objective:    Vitals:   11/21/17 0942  BP: 100/62  Pulse: 68    Physical Exam; well-developed white female in no acute distress, pleasant blood pressure 100/62 pulse 68, BMI 25.2.  HEENT; nontraumatic normocephalic EOMI PERRLA sclera anicteric oral mucosa moist, Cardiovascular ;regular rate and rhythm with S1-S2 no murmur rub or gallop, Pulmonary ;clear bilaterally, Abdomen ;soft, she has some tenderness generally in the mid abdomen no palpable mass or hepatosplenomegaly no guarding or rebound bowel sounds are present, Rectal; exam not done today, Extremities; no clubbing cyanosis or edema skin warm dry, Neuro psych ;alert and oriented, grossly nonfocal mood and affect appropriate       Assessment & Plan:   #3 42 year old white female with history of vaginal cancer in 2015 post chemotherapy and radiation. Patient has previously documented radiation proctitis and continues to have intermittent rectal bleeding which has increased somewhat over the past couple of months. #2 new vaginal ulcer felt to be radiation related-being monitored by GYN oncology  #3   2 to 15-monthhistory of mid abdominal crampy discomfort new/etiology not clear no recent imaging #4  GERD-stable  Plan; [facility] for APC meant to the radiation proctitis.  Procedure was discussed in detail with the patient including indications risks and benefits and she is agreeable to proceed  We will also schedule for CT of the abdomen and pelvis with contrast.  Check CBC and be met today.    Stacy Perez S Stacy Odonovan PA-C 11/21/2017   Cc: Stacy Redwood MD

## 2017-11-21 NOTE — Telephone Encounter (Signed)
Sent new colonoscopy instructions to the patient for the rescheduled date of 12-12-2017 at 7:30 am at [facility].

## 2017-11-22 NOTE — Progress Notes (Signed)
I agree with the above note, plan 

## 2017-12-03 ENCOUNTER — Ambulatory Visit (INDEPENDENT_AMBULATORY_CARE_PROVIDER_SITE_OTHER)
Admission: RE | Admit: 2017-12-03 | Discharge: 2017-12-03 | Disposition: A | Discharge status: Home / Self Care | Payer: PRIVATE HEALTH INSURANCE | Source: Ambulatory Visit | Attending: Physician Assistant | Admitting: Physician Assistant

## 2017-12-03 DIAGNOSIS — K627 Radiation proctitis: Secondary | ICD-10-CM

## 2017-12-03 DIAGNOSIS — R1084 Generalized abdominal pain: Secondary | ICD-10-CM

## 2017-12-03 DIAGNOSIS — C52 Malignant neoplasm of vagina: Secondary | ICD-10-CM

## 2017-12-03 MED ORDER — IOPAMIDOL (ISOVUE-300) INJECTION 61%
100.0000 mL | Freq: Once | INTRAVENOUS | Status: AC | PRN
  Administered 2017-12-03: 100 mL via INTRAVENOUS

## 2017-12-05 ENCOUNTER — Telehealth: Payer: Self-pay | Admitting: Physician Assistant

## 2017-12-06 NOTE — Telephone Encounter (Signed)
Please see the imaging result note.

## 2017-12-10 ENCOUNTER — Encounter (HOSPITAL_COMMUNITY): Payer: Self-pay | Admitting: *Deleted

## 2017-12-11 ENCOUNTER — Encounter (HOSPITAL_COMMUNITY): Payer: Self-pay | Admitting: *Deleted

## 2017-12-11 ENCOUNTER — Other Ambulatory Visit: Payer: Self-pay

## 2017-12-12 ENCOUNTER — Ambulatory Visit (HOSPITAL_COMMUNITY): Payer: No Typology Code available for payment source | Admitting: Anesthesiology

## 2017-12-12 ENCOUNTER — Ambulatory Visit (HOSPITAL_COMMUNITY)
Admission: RE | Admit: 2017-12-12 | Discharge: 2017-12-12 | Disposition: A | Discharge status: Home / Self Care | Payer: No Typology Code available for payment source | Source: Ambulatory Visit | Attending: Gastroenterology | Admitting: Gastroenterology

## 2017-12-12 ENCOUNTER — Encounter (HOSPITAL_COMMUNITY)
Admission: RE | Disposition: A | Discharge status: Home / Self Care | Payer: Self-pay | Source: Ambulatory Visit | Attending: Gastroenterology

## 2017-12-12 ENCOUNTER — Encounter (HOSPITAL_COMMUNITY): Payer: Self-pay | Admitting: *Deleted

## 2017-12-12 DIAGNOSIS — Z8589 Personal history of malignant neoplasm of other organs and systems: Secondary | ICD-10-CM | POA: Diagnosis not present

## 2017-12-12 DIAGNOSIS — K627 Radiation proctitis: Secondary | ICD-10-CM

## 2017-12-12 DIAGNOSIS — Z882 Allergy status to sulfonamides status: Secondary | ICD-10-CM | POA: Diagnosis not present

## 2017-12-12 DIAGNOSIS — Z87891 Personal history of nicotine dependence: Secondary | ICD-10-CM | POA: Diagnosis not present

## 2017-12-12 DIAGNOSIS — R1084 Generalized abdominal pain: Secondary | ICD-10-CM

## 2017-12-12 DIAGNOSIS — Z880 Allergy status to penicillin: Secondary | ICD-10-CM | POA: Diagnosis not present

## 2017-12-12 DIAGNOSIS — K219 Gastro-esophageal reflux disease without esophagitis: Secondary | ICD-10-CM | POA: Insufficient documentation

## 2017-12-12 DIAGNOSIS — C52 Malignant neoplasm of vagina: Secondary | ICD-10-CM

## 2017-12-12 DIAGNOSIS — Z923 Personal history of irradiation: Secondary | ICD-10-CM | POA: Diagnosis not present

## 2017-12-12 DIAGNOSIS — N765 Ulceration of vagina: Secondary | ICD-10-CM | POA: Insufficient documentation

## 2017-12-12 DIAGNOSIS — Z9221 Personal history of antineoplastic chemotherapy: Secondary | ICD-10-CM | POA: Diagnosis not present

## 2017-12-12 DIAGNOSIS — K5520 Angiodysplasia of colon without hemorrhage: Secondary | ICD-10-CM | POA: Insufficient documentation

## 2017-12-12 DIAGNOSIS — K625 Hemorrhage of anus and rectum: Secondary | ICD-10-CM

## 2017-12-12 HISTORY — DX: Nausea with vomiting, unspecified: Z98.890

## 2017-12-12 HISTORY — PX: COLONOSCOPY WITH PROPOFOL: SHX5780

## 2017-12-12 HISTORY — DX: Other complications of anesthesia, initial encounter: T88.59XA

## 2017-12-12 HISTORY — PX: HOT HEMOSTASIS: SHX5433

## 2017-12-12 HISTORY — DX: Adverse effect of unspecified anesthetic, initial encounter: T41.45XA

## 2017-12-12 HISTORY — DX: Nausea with vomiting, unspecified: R11.2

## 2017-12-12 SURGERY — COLONOSCOPY WITH PROPOFOL
Anesthesia: Monitor Anesthesia Care

## 2017-12-12 MED ORDER — PROPOFOL 10 MG/ML IV BOLUS
INTRAVENOUS | Status: AC
  Filled 2017-12-12: qty 80

## 2017-12-12 MED ORDER — PROPOFOL 500 MG/50ML IV EMUL
INTRAVENOUS | Status: DC | PRN
  Administered 2017-12-12: 40 mg via INTRAVENOUS
  Administered 2017-12-12: 30 mg via INTRAVENOUS

## 2017-12-12 MED ORDER — LACTATED RINGERS IV SOLN
INTRAVENOUS | Status: DC
  Administered 2017-12-12: 07:00:00 via INTRAVENOUS

## 2017-12-12 MED ORDER — FENTANYL CITRATE (PF) 250 MCG/5ML IJ SOLN: 50.0000 ug | Freq: Once | INTRAMUSCULAR | Status: DC

## 2017-12-12 MED ORDER — HYOSCYAMINE SULFATE 0.125 MG PO TABS: 0.1250 mg | ORAL_TABLET | ORAL | 5 refills | Status: AC | PRN

## 2017-12-12 MED ORDER — ONDANSETRON HCL 4 MG/2ML IJ SOLN
INTRAMUSCULAR | Status: DC | PRN
  Administered 2017-12-12: 4 mg via INTRAVENOUS

## 2017-12-12 MED ORDER — PROPOFOL 500 MG/50ML IV EMUL
INTRAVENOUS | Status: DC | PRN
  Administered 2017-12-12: 100 ug/kg/min via INTRAVENOUS

## 2017-12-12 MED ORDER — MIDAZOLAM HCL 2 MG/2ML IJ SOLN
INTRAMUSCULAR | Status: AC
  Filled 2017-12-12: qty 2

## 2017-12-12 MED ORDER — SODIUM CHLORIDE 0.9 % IV SOLN: INTRAVENOUS | Status: DC

## 2017-12-12 MED ORDER — MIDAZOLAM HCL 5 MG/5ML IJ SOLN
INTRAMUSCULAR | Status: DC | PRN
  Administered 2017-12-12: 2 mg via INTRAVENOUS

## 2017-12-12 MED ORDER — FENTANYL CITRATE (PF) 100 MCG/2ML IJ SOLN
INTRAMUSCULAR | Status: AC
  Filled 2017-12-12: qty 2

## 2017-12-12 SURGICAL SUPPLY — 21 items

## 2017-12-12 NOTE — Transfer of Care (Signed)
Immediate Anesthesia Transfer of Care Note  Patient: Stacy Perez  Procedure(s) Performed: Procedure(s): COLONOSCOPY WITH PROPOFOL WITH APC (N/A) HOT HEMOSTASIS (ARGON PLASMA COAGULATION/BICAP) (N/A)  Patient Location: PACU  Anesthesia Type:MAC  Level of Consciousness:  sedated, patient cooperative and responds to stimulation  Airway & Oxygen Therapy:Patient Spontanous Breathing and Patient connected to face mask oxgen  Post-op Assessment:  Report given to PACU RN and Post -op Vital signs reviewed and stable  Post vital signs:  Reviewed and stable  Last Vitals:  Vitals:   12/12/17 0628  BP: (!) 94/55  Pulse: 63  Resp: 11  Temp: 36.7 C  SpO2: 10%    Complications: No apparent anesthesia complications

## 2017-12-12 NOTE — Anesthesia Postprocedure Evaluation (Signed)
Anesthesia Post Note  Patient: Stacy Perez  Procedure(s) Performed: COLONOSCOPY WITH PROPOFOL WITH APC (N/A ) HOT HEMOSTASIS (ARGON PLASMA COAGULATION/BICAP) (N/A )     Patient location during evaluation: PACU Anesthesia Type: MAC Level of consciousness: awake and alert Pain management: pain level controlled Vital Signs Assessment: post-procedure vital signs reviewed and stable Respiratory status: spontaneous breathing, nonlabored ventilation, respiratory function stable and patient connected to nasal cannula oxygen Cardiovascular status: stable and blood pressure returned to baseline Postop Assessment: no apparent nausea or vomiting Anesthetic complications: no    Last Vitals:  Vitals:   12/12/17 0826 12/12/17 0830  BP:  104/66  Pulse: 63 62  Resp: 14 17  Temp:    SpO2: 100% 100%    Last Pain:  Vitals:   12/12/17 0826  TempSrc:   PainSc: 3                  Saniya Tranchina COKER

## 2017-12-12 NOTE — Discharge Instructions (Signed)

## 2017-12-12 NOTE — Interval H&P Note (Signed)
History and Physical Interval Note:  12/12/2017 7:16 AM  Stacy Perez  has presented today for surgery, with the diagnosis of history of radiation proctitis, generalized abdominal pain x 2 months, history of vaginal cancer 2015 with radiation  The various methods of treatment have been discussed with the patient and family. After consideration of risks, benefits and other options for treatment, the patient has consented to  Procedure(s): COLONOSCOPY WITH PROPOFOL WITH APC (N/A) as a surgical intervention .  The patient's history has been reviewed, patient examined, no change in status, stable for surgery.  I have reviewed the patient's chart and labs.  Questions were answered to the patient's satisfaction.     Milus Banister

## 2017-12-12 NOTE — Op Note (Signed)
[facility] Patient Name: Stacy Perez Procedure Date: 12/12/2017 MRN: [id number] Attending MD: Milus Banister , MD Date of Birth: May 06, 1975 CSN: [id number] Age: 42 Admit Type: Outpatient Procedure:                Colonoscopy Indications:              Generalized abdominal pain, Rectal bleeding Providers:                Milus Banister, MD, Cleda Daub, RN, Charolette Child, Technician, Arnoldo Hooker, CRNA Referring MD:              Medicines:                Monitored Anesthesia Care Complications:            No immediate complications. Estimated blood loss:                            None. Estimated Blood Loss:     Estimated blood loss: none. Procedure:                Pre-Anesthesia Assessment:                           - Prior to the procedure, a History and Physical                            was performed, and patient medications and                            allergies were reviewed. The patient's tolerance of                            previous anesthesia was also reviewed. The risks                            and benefits of the procedure and the sedation                            options and risks were discussed with the patient.                            All questions were answered, and informed consent                            was obtained. Prior Anticoagulants: The patient has                            taken no previous anticoagulant or antiplatelet                            agents. ASA Grade Assessment: II - A patient with  mild systemic disease. After reviewing the risks                            and benefits, the patient was deemed in                            satisfactory condition to undergo the procedure.                           After obtaining informed consent, the colonoscope                            was passed under direct vision. Throughout the                            procedure,  the patient's blood pressure, pulse, and                            oxygen saturations were monitored continuously. The                            CF-HQ190L ([id number]) Olympus Adult Colonoscope was                            introduced through the anus and advanced to the the                            cecum, identified by appendiceal orifice and                            ileocecal valve (only fleeting views were possible                            given angulation). The colonoscopy was performed                            without difficulty. The patient tolerated the                            procedure well. The quality of the bowel                            preparation was good. The ileocecal valve,                            appendiceal orifice, and rectum were photographed. Scope In: 7:36:55 AM Scope Out: 7:56:27 AM Scope Withdrawal Time: 0 hours 14 minutes 15 seconds  Total Procedure Duration: 0 hours 19 minutes 32 seconds  Findings:      Typical appearing radiation related AVMs, proctitis. Some were       spontaneously oozing. All were treated with application of APC       successfully.      The exam was otherwise without abnormality on direct and retroflexion       views. Impression:               -  Typical appearing radiation related AVMs,                            proctitis. Treated with APC successfully.                           - The examination was otherwise normal on direct                            and retroflexion views.                           - No polyps or cancers. Moderate Sedation:      N/A- Per Anesthesia Care Recommendation:           - Patient has a contact number available for                            emergencies. The signs and symptoms of potential                            delayed complications were discussed with the                            patient. Return to normal activities tomorrow.                            Written discharge instructions  were provided to the                            patient.                           - Resume previous diet.                           - Continue present medications.                           - Trial of sublingual antispasmodics for your                            abdominal pains: new prescription writtent today.                            Please call Dr. Ardis Hughs' office in 4-5 weeks to                            report on your response to the new medicine and                            also to report on your bleeding after today's                            treatment. Procedure Code(s):        --- Professional ---  45382, Colonoscopy, flexible; with control of                            bleeding, any method Diagnosis Code(s):        --- Professional ---                           K55.20, Angiodysplasia of colon without hemorrhage                           R10.84, Generalized abdominal pain                           K62.5, Hemorrhage of anus and rectum CPT copyright 2018 [employer]. All rights reserved. The codes documented in this report are preliminary and upon coder review may  be revised to meet current compliance requirements. Milus Banister, MD 12/12/2017 8:05:19 AM This report has been signed electronically. Number of Addenda: 0

## 2017-12-12 NOTE — Anesthesia Preprocedure Evaluation (Signed)
Anesthesia Evaluation  Patient identified by MRN, date of birth, ID band Patient awake    Reviewed: Allergy & Precautions, NPO status , Patient's Chart, lab work & pertinent test results  Airway Mallampati: II  TM Distance: >3 FB Neck ROM: Full    Dental  (+) Teeth Intact, Dental Advisory Given   Pulmonary former smoker,    breath sounds clear to auscultation       Cardiovascular  Rhythm:Regular Rate:Normal     Neuro/Psych    GI/Hepatic   Endo/Other    Renal/GU      Musculoskeletal   Abdominal   Peds  Hematology   Anesthesia Other Findings   Reproductive/Obstetrics                             Anesthesia Physical Anesthesia Plan  ASA: II  Anesthesia Plan: MAC   Post-op Pain Management:    Induction: Intravenous  PONV Risk Score and Plan: Ondansetron and Propofol infusion  Airway Management Planned: Natural Airway and Simple Face Mask  Additional Equipment:   Intra-op Plan:   Post-operative Plan:   Informed Consent: I have reviewed the patients History and Physical, chart, labs and discussed the procedure including the risks, benefits and alternatives for the proposed anesthesia with the patient or authorized representative who has indicated his/her understanding and acceptance.   Dental advisory given  Plan Discussed with: CRNA and Anesthesiologist  Anesthesia Plan Comments:         Anesthesia Quick Evaluation

## 2017-12-16 ENCOUNTER — Telehealth: Payer: Self-pay | Admitting: Physician Assistant

## 2017-12-16 NOTE — Telephone Encounter (Signed)
Pt had colonoscopy last Thursday, she has been experiencing pressure in rectal area, she also has urgency to go to the bathroom and when she has a bm is painful. Pls call her.

## 2017-12-16 NOTE — Telephone Encounter (Signed)
Left message on machine to call back  

## 2017-12-17 NOTE — Telephone Encounter (Signed)
Left message on machine to call back  

## 2017-12-17 NOTE — Telephone Encounter (Signed)
The patient has been notified of this information and all questions answered.

## 2017-12-17 NOTE — Telephone Encounter (Signed)
Please reassure her that this is almost certainly related to the APC treatments and is not completely unexpected.  Recommend citrucel start (citrucel (orange flavored) powder fiber supplement.  This may cause some bloating at first but that usually goes away. Begin with a small spoonful and work your way up to a large, heaping spoonful daily over a week.) and she should call in 2-3 weeks to report on her response.

## 2017-12-17 NOTE — Telephone Encounter (Signed)
Rectal pressure, pink tinged mucous, bowel movements are soft but she does not feel as if she empties completley.  Uncomfortable constantly, she feels as if she need to have a BM all the time.  These symptoms started after recent colonoscopy 10/24.   Please advise

## 2018-02-25 ENCOUNTER — Other Ambulatory Visit: Payer: Self-pay | Admitting: Internal Medicine

## 2018-02-25 DIAGNOSIS — Z1231 Encounter for screening mammogram for malignant neoplasm of breast: Secondary | ICD-10-CM

## 2018-02-27 ENCOUNTER — Ambulatory Visit
Admission: RE | Admit: 2018-02-27 | Discharge: 2018-02-27 | Disposition: A | Discharge status: Home / Self Care | Payer: PRIVATE HEALTH INSURANCE | Source: Ambulatory Visit | Attending: Internal Medicine | Admitting: Internal Medicine

## 2018-02-27 DIAGNOSIS — Z1231 Encounter for screening mammogram for malignant neoplasm of breast: Secondary | ICD-10-CM

## 2019-03-30 ENCOUNTER — Other Ambulatory Visit: Payer: Self-pay | Admitting: Internal Medicine

## 2019-03-30 DIAGNOSIS — Z1231 Encounter for screening mammogram for malignant neoplasm of breast: Secondary | ICD-10-CM

## 2019-05-06 ENCOUNTER — Other Ambulatory Visit: Payer: Self-pay

## 2019-05-06 ENCOUNTER — Ambulatory Visit
Admission: RE | Admit: 2019-05-06 | Discharge: 2019-05-06 | Disposition: A | Discharge status: Home / Self Care | Payer: PRIVATE HEALTH INSURANCE | Source: Ambulatory Visit | Attending: Internal Medicine | Admitting: Internal Medicine

## 2019-05-06 DIAGNOSIS — Z1231 Encounter for screening mammogram for malignant neoplasm of breast: Secondary | ICD-10-CM

## 2020-11-25 ENCOUNTER — Telehealth: Payer: Self-pay | Admitting: Gastroenterology

## 2020-11-25 NOTE — Telephone Encounter (Signed)
Left message to call back  

## 2020-11-25 NOTE — Telephone Encounter (Signed)
Hi Dr. Ardis Hughs, we have received a referral from pt's PCP for a repeat colon. Pt had colon in 2019. Referral does not mention that pt is having GI sxs. Please advise when pt is due for colon. Thank you.

## 2021-10-04 DIAGNOSIS — R0681 Apnea, not elsewhere classified: Secondary | ICD-10-CM | POA: Diagnosis not present

## 2021-10-05 DIAGNOSIS — R0681 Apnea, not elsewhere classified: Secondary | ICD-10-CM | POA: Diagnosis not present

## 2021-10-28 IMAGING — MG DIGITAL SCREENING BILAT W/ TOMO W/ CAD
6 of 10 series · 6 of 30 positions shown · non-contrast
Comparison: Previous exam(s).

CLINICAL DATA: Screening.

EXAM:
DIGITAL SCREENING BILATERAL MAMMOGRAM WITH TOMO AND CAD

[L CC synth-2D (1 of 2)]
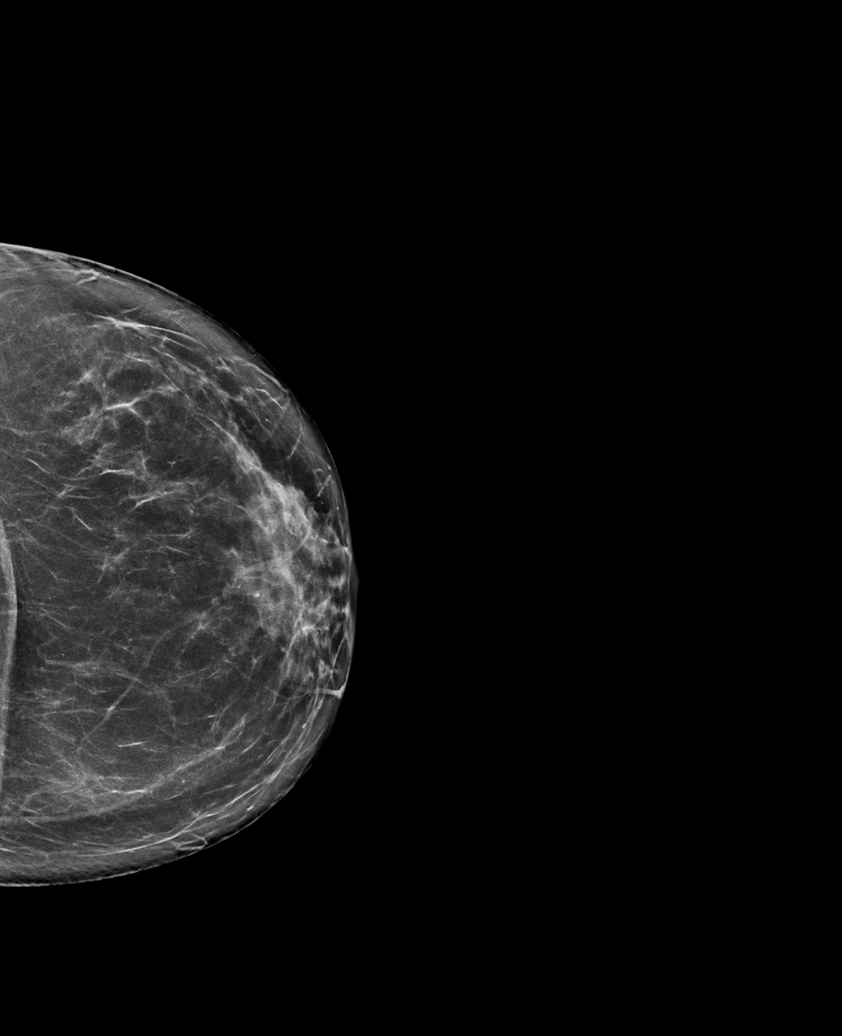

[L CC synth-2D (2 of 2)]
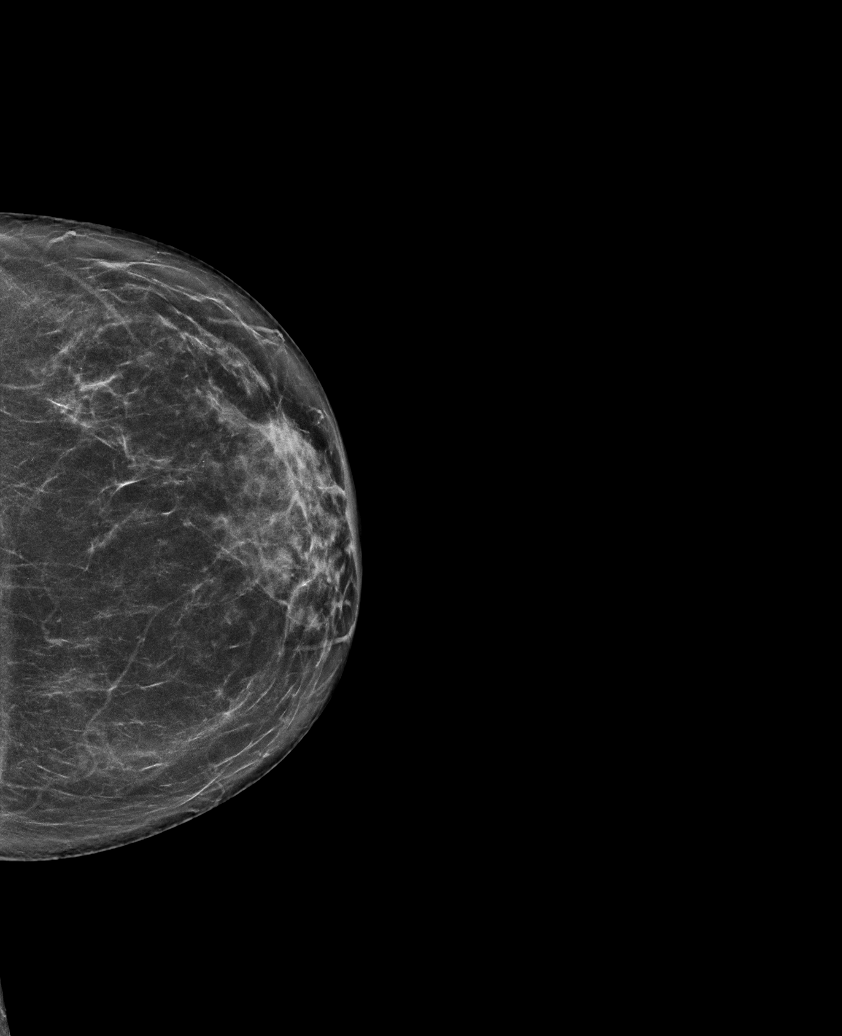

[R CC synth-2D]
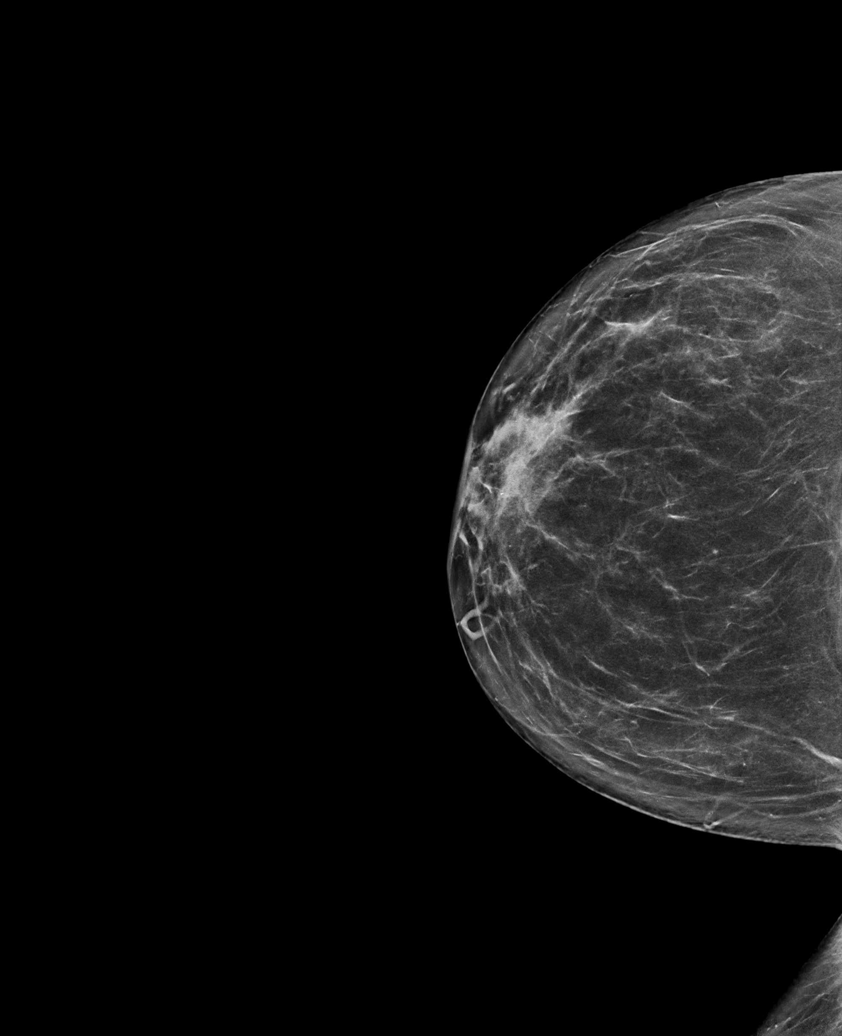

[L MLO synth-2D]
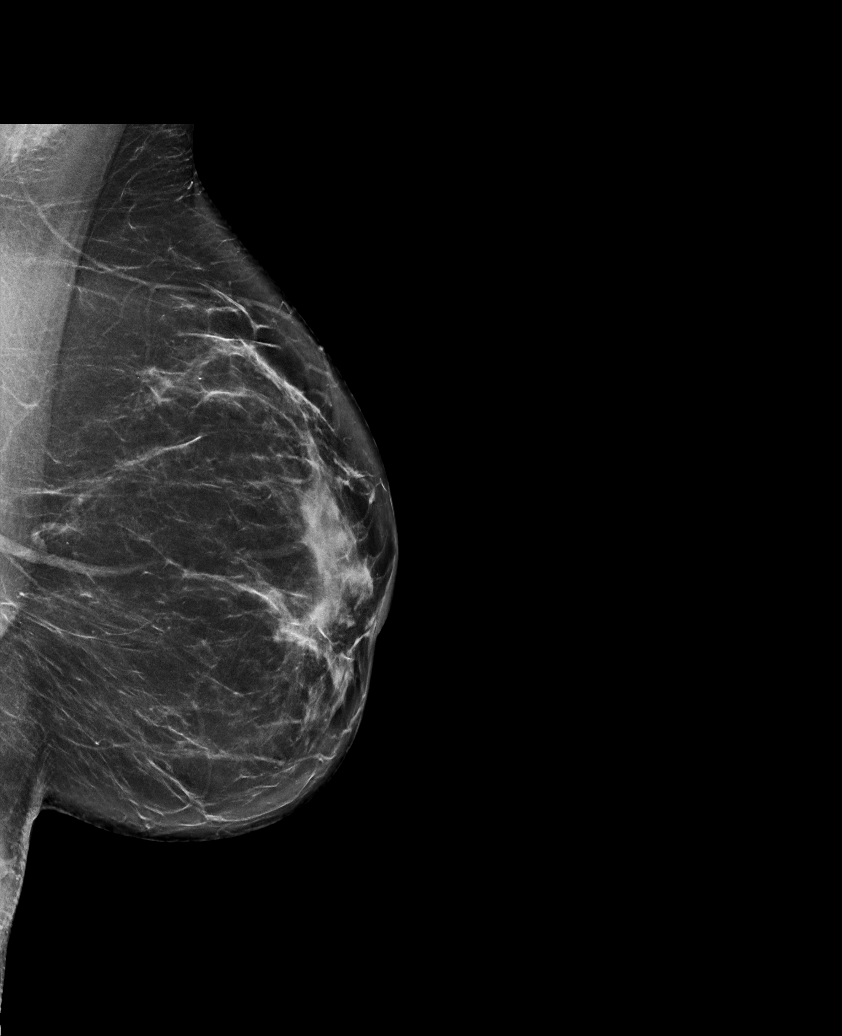

[R MLO synth-2D]
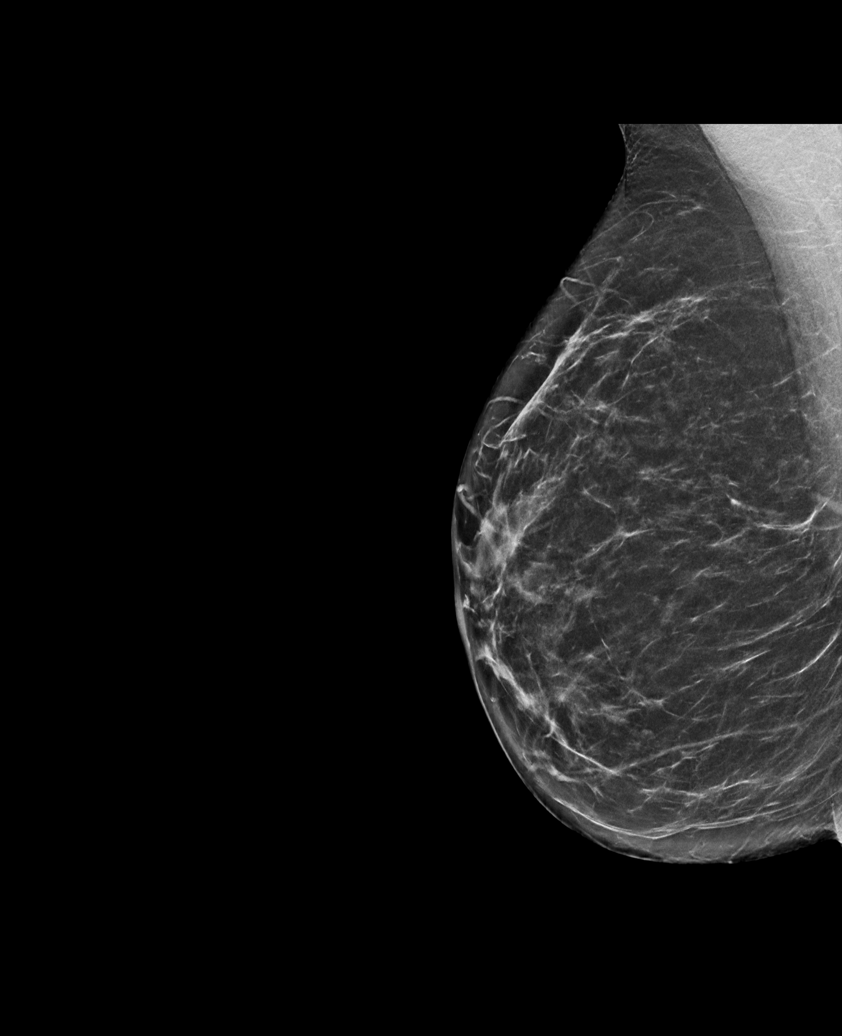

[L MLO tomo · tomo slice 38/75.0]
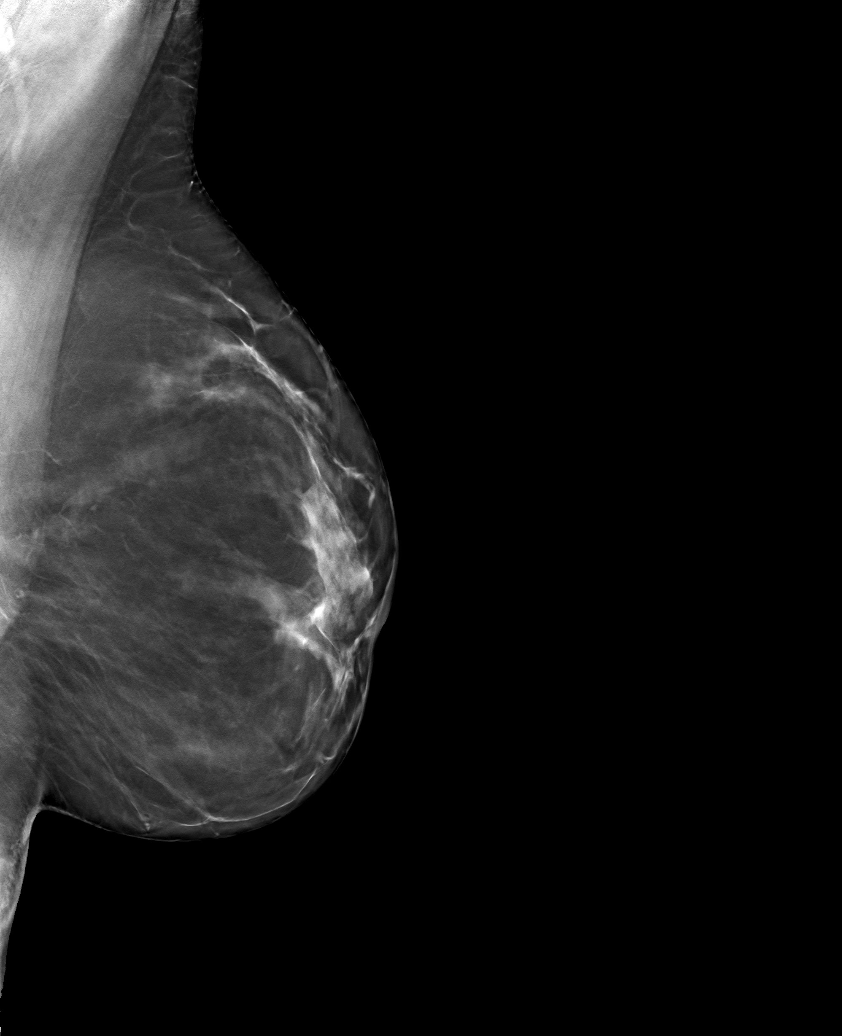

[6 of 30 positions shown; findings below may reference images not displayed]

ACR Breast Density Category c: The breast tissue is heterogeneously
dense, which may obscure small masses.
FINDINGS: There are no findings suspicious for malignancy. Images were
processed with CAD.
IMPRESSION: No mammographic evidence of malignancy. A result letter of this
screening mammogram will be mailed directly to the patient.

RECOMMENDATION:
Screening mammogram in one year. (Code:FT-U-LHB)

BI-RADS CATEGORY  1: Negative.

## 2021-11-23 DIAGNOSIS — N895 Stricture and atresia of vagina: Secondary | ICD-10-CM | POA: Diagnosis not present

## 2021-11-23 DIAGNOSIS — C52 Malignant neoplasm of vagina: Secondary | ICD-10-CM | POA: Diagnosis not present

## 2021-12-05 DIAGNOSIS — Z0000 Encounter for general adult medical examination without abnormal findings: Secondary | ICD-10-CM | POA: Diagnosis not present

## 2021-12-05 DIAGNOSIS — D539 Nutritional anemia, unspecified: Secondary | ICD-10-CM | POA: Diagnosis not present

## 2021-12-05 DIAGNOSIS — R7989 Other specified abnormal findings of blood chemistry: Secondary | ICD-10-CM | POA: Diagnosis not present

## 2021-12-12 ENCOUNTER — Other Ambulatory Visit: Payer: Self-pay | Admitting: Internal Medicine

## 2021-12-12 DIAGNOSIS — Z0000 Encounter for general adult medical examination without abnormal findings: Secondary | ICD-10-CM | POA: Diagnosis not present

## 2021-12-12 DIAGNOSIS — R002 Palpitations: Secondary | ICD-10-CM | POA: Diagnosis not present

## 2021-12-12 DIAGNOSIS — Z1339 Encounter for screening examination for other mental health and behavioral disorders: Secondary | ICD-10-CM | POA: Diagnosis not present

## 2021-12-12 DIAGNOSIS — Z1331 Encounter for screening for depression: Secondary | ICD-10-CM | POA: Diagnosis not present

## 2021-12-12 DIAGNOSIS — Z1231 Encounter for screening mammogram for malignant neoplasm of breast: Secondary | ICD-10-CM

## 2022-02-02 ENCOUNTER — Ambulatory Visit
Admission: RE | Admit: 2022-02-02 | Discharge: 2022-02-02 | Disposition: A | Discharge status: Home / Self Care | Payer: BC Managed Care – PPO | Source: Ambulatory Visit | Attending: Internal Medicine | Admitting: Internal Medicine

## 2022-02-02 DIAGNOSIS — C52 Malignant neoplasm of vagina: Secondary | ICD-10-CM | POA: Diagnosis not present

## 2022-02-02 DIAGNOSIS — Z1231 Encounter for screening mammogram for malignant neoplasm of breast: Secondary | ICD-10-CM

## 2022-02-02 DIAGNOSIS — N895 Stricture and atresia of vagina: Secondary | ICD-10-CM | POA: Diagnosis not present

## 2022-05-28 ENCOUNTER — Emergency Department (HOSPITAL_BASED_OUTPATIENT_CLINIC_OR_DEPARTMENT_OTHER)
Admission: EM | Admit: 2022-05-28 | Discharge: 2022-05-28 | Disposition: A | Discharge status: Home / Self Care | Payer: BC Managed Care – PPO | Attending: Emergency Medicine | Admitting: Emergency Medicine

## 2022-05-28 ENCOUNTER — Encounter (HOSPITAL_BASED_OUTPATIENT_CLINIC_OR_DEPARTMENT_OTHER): Payer: Self-pay | Admitting: Urology

## 2022-05-28 ENCOUNTER — Other Ambulatory Visit: Payer: Self-pay

## 2022-05-28 DIAGNOSIS — C52 Malignant neoplasm of vagina: Secondary | ICD-10-CM | POA: Diagnosis not present

## 2022-05-28 DIAGNOSIS — R197 Diarrhea, unspecified: Secondary | ICD-10-CM | POA: Diagnosis not present

## 2022-05-28 DIAGNOSIS — R112 Nausea with vomiting, unspecified: Secondary | ICD-10-CM | POA: Diagnosis not present

## 2022-05-28 DIAGNOSIS — E876 Hypokalemia: Secondary | ICD-10-CM

## 2022-05-28 DIAGNOSIS — R790 Abnormal level of blood mineral: Secondary | ICD-10-CM

## 2022-05-28 DIAGNOSIS — E860 Dehydration: Secondary | ICD-10-CM

## 2022-05-28 DIAGNOSIS — E612 Magnesium deficiency: Secondary | ICD-10-CM | POA: Diagnosis not present

## 2022-05-28 LAB — COMPREHENSIVE METABOLIC PANEL
ALT: 18 U/L (ref 0–44)
AST: 27 U/L (ref 15–41)
Albumin: 3.6 g/dL (ref 3.5–5.0)
Alkaline Phosphatase: 42 U/L (ref 38–126)
Anion gap: 11 (ref 5–15)
BUN: 24 mg/dL — ABNORMAL HIGH (ref 6–20)
CO2: 25 mmol/L (ref 22–32)
Calcium: 7.9 mg/dL — ABNORMAL LOW (ref 8.9–10.3)
Chloride: 96 mmol/L — ABNORMAL LOW (ref 98–111)
Creatinine, Ser: 1.26 mg/dL — ABNORMAL HIGH (ref 0.44–1.00)
GFR, Estimated: 53 mL/min — ABNORMAL LOW (ref >60)
Glucose, Bld: 128 mg/dL — ABNORMAL HIGH (ref 70–99)
Potassium: 2.8 mmol/L — ABNORMAL LOW (ref 3.5–5.1)
Sodium: 132 mmol/L — ABNORMAL LOW (ref 135–145)
Total Bilirubin: 0.9 mg/dL (ref 0.3–1.2)
Total Protein: 6.4 g/dL — ABNORMAL LOW (ref 6.5–8.1)

## 2022-05-28 LAB — CBC
HCT: 36.5 % (ref 36.0–46.0)
Hemoglobin: 13 g/dL (ref 12.0–15.0)
MCH: 31.9 pg (ref 26.0–34.0)
MCHC: 35.6 g/dL (ref 30.0–36.0)
MCV: 89.5 fL (ref 80.0–100.0)
Platelets: 179 10*3/uL (ref 150–400)
RBC: 4.08 MIL/uL (ref 3.87–5.11)
RDW: 11.6 % (ref 11.5–15.5)
WBC: 6.8 10*3/uL (ref 4.0–10.5)
nRBC: 0 % (ref 0.0–0.2)

## 2022-05-28 LAB — URINALYSIS, ROUTINE W REFLEX MICROSCOPIC
Bilirubin Urine: NEGATIVE
Glucose, UA: NEGATIVE mg/dL
Hgb urine dipstick: NEGATIVE
Ketones, ur: NEGATIVE mg/dL
Leukocytes,Ua: NEGATIVE
Nitrite: NEGATIVE
Protein, ur: 30 mg/dL — AB
Specific Gravity, Urine: 1.02 (ref 1.005–1.030)
pH: 7 (ref 5.0–8.0)

## 2022-05-28 LAB — URINALYSIS, MICROSCOPIC (REFLEX)

## 2022-05-28 LAB — MAGNESIUM: Magnesium: 1.5 mg/dL — ABNORMAL LOW (ref 1.7–2.4)

## 2022-05-28 LAB — LIPASE, BLOOD: Lipase: 40 U/L (ref 11–51)

## 2022-05-28 MED ORDER — POTASSIUM CHLORIDE 10 MEQ/100ML IV SOLN
10.0000 meq | INTRAVENOUS | Status: AC
  Administered 2022-05-28 (×2): 10 meq via INTRAVENOUS
  Filled 2022-05-28 (×2): qty 100

## 2022-05-28 MED ORDER — CALCIUM CARBONATE ANTACID 500 MG PO CHEW
1.0000 | CHEWABLE_TABLET | Freq: Once | ORAL | Status: AC
  Administered 2022-05-28: 200 mg via ORAL
  Filled 2022-05-28: qty 1

## 2022-05-28 MED ORDER — SODIUM CHLORIDE 0.9 % IV BOLUS
1000.0000 mL | Freq: Once | INTRAVENOUS | Status: AC
  Administered 2022-05-28: 1000 mL via INTRAVENOUS

## 2022-05-28 MED ORDER — POTASSIUM CHLORIDE CRYS ER 20 MEQ PO TBCR
40.0000 meq | EXTENDED_RELEASE_TABLET | Freq: Once | ORAL | Status: AC
  Administered 2022-05-28: 40 meq via ORAL
  Filled 2022-05-28: qty 2

## 2022-05-28 MED ORDER — SODIUM CHLORIDE 0.9 % IV SOLN: INTRAVENOUS | Status: DC | PRN

## 2022-05-28 MED ORDER — POTASSIUM CHLORIDE ER 10 MEQ PO TBCR: 10.0000 meq | EXTENDED_RELEASE_TABLET | Freq: Every day | ORAL | 0 refills | Status: AC

## 2022-05-28 MED ORDER — MAGNESIUM SULFATE 2 GM/50ML IV SOLN
2.0000 g | Freq: Once | INTRAVENOUS | Status: AC
  Administered 2022-05-28: 2 g via INTRAVENOUS
  Filled 2022-05-28: qty 50

## 2022-05-28 MED ORDER — ONDANSETRON HCL 4 MG/2ML IJ SOLN
4.0000 mg | Freq: Once | INTRAMUSCULAR | Status: AC
  Administered 2022-05-28: 4 mg via INTRAVENOUS
  Filled 2022-05-28: qty 2

## 2022-05-28 NOTE — Discharge Instructions (Signed)
It was a pleasure taking care of you today.  You are seen today for passing out, and having spasms in your hands and feet after having nausea and vomiting.  Your blood work showed low sodium, potassium, magnesium and calcium.  We replaced all of these in the ER.  You are also given IV fluids for the dehydration.  It is important to be eating foods high in potassium such as orange juice, potatoes, cantaloupe, and bananas.  Have your blood work rechecked in a couple of days.

## 2022-05-28 NOTE — ED Notes (Signed)
Pt made aware of need for urine , pt stated " didn't feel like getting up right now". Will try again later.

## 2022-05-28 NOTE — ED Provider Notes (Signed)
[place] [facility] Provider Note   CSN: [id number] Arrival date & time: 05/28/22  1308     History  Chief Complaint  Patient presents with   Emesis    Stacy Perez is a 47 y.o. female.  He has past medical history of cervical dysplasia status post hysterectomy, vaginal cancer status postchemotherapy and radiation.  She is in remission for 9 years, no other chronic medical conditions.  Presents the ER complaining of nausea vomiting and diarrhea with a syncopal episode. States "I had a stomach bug since this morning".  She is you having a lot of nausea and vomiting with several episodes of loose stools as well.  She is feeling cold and overall unwell decided to get a bath, when she stood up to go to the bath she got dizzy and states she passed out, her husband states he found her laying on the floor.  She woke up quickly but states her hands and feet were tingling bilaterally and were "locked up" for about 15 minutes, she states she is returned to normal but was unable to eat or drink anything today due to her illness.   Emesis      Home Medications Prior to Admission medications   Medication Sig Start Date End Date Taking? Authorizing Provider  potassium chloride (KLOR-CON) 10 MEQ tablet Take 1 tablet (10 mEq total) by mouth daily. 05/28/22  Yes Kristeena Meineke A, PA-C  albuterol (PROVENTIL HFA;VENTOLIN HFA) 108 (90 BASE) MCG/ACT inhaler Inhale 2 puffs into the lungs every 4 (four) hours as needed for wheezing. 01/17/11 01/17/12  Lattie Haw, MD  fluticasone (FLONASE) 50 MCG/ACT nasal spray Place 2 sprays into both nostrils daily.    [provider]  hyoscyamine (LEVSIN, ANASPAZ) 0.125 MG tablet Take 1 tablet (0.125 mg total) by mouth every 4 (four) hours as needed. 12/12/17   Rachael Fee, MD  ibuprofen (ADVIL,MOTRIN) 200 MG tablet Take 400 mg by mouth every 6 (six) hours as needed for moderate pain.    [provider]   Multiple Vitamin (MULTI-VITAMINS) TABS Take 1 tablet by mouth daily.     [provider]  omeprazole (PRILOSEC) 20 MG capsule Take 20 mg by mouth daily.    [provider]  pentoxifylline (TRENTAL) 400 MG CR tablet Take 400 mg by mouth 3 (three) times daily with meals.    [provider]  polyethylene glycol-electrolytes (NULYTELY/GOLYTELY) 420 g solution Take as directed for colonoscopy. 11/21/17   Esterwood, Amy S, PA-C  PREMARIN 0.625 MG tablet Take 0.625 mg by mouth daily.  04/14/16   [provider]      Allergies    Penicillins and Sulfa antibiotics    Review of Systems   Review of Systems  Gastrointestinal:  Positive for vomiting.    Physical Exam Updated Vital Signs BP (!) 114/58 (BP Location: Left Arm)   Pulse 77   Temp 98.1 F (36.7 C) (Oral)   Resp 15   Ht 5\' 3"  (1.6 m)   Wt 56.7 kg   SpO2 100%   BMI 22.14 kg/m  Physical Exam Vitals and nursing note reviewed.  Constitutional:      General: She is not in acute distress.    Appearance: She is well-developed.  HENT:     Head: Normocephalic and atraumatic.     Mouth/Throat:     Mouth: Mucous membranes are moist.  Eyes:     Conjunctiva/sclera: Conjunctivae normal.  Cardiovascular:  Rate and Rhythm: Normal rate and regular rhythm.     Heart sounds: No murmur heard. Pulmonary:     Effort: Pulmonary effort is normal. No respiratory distress.     Breath sounds: Normal breath sounds.  Abdominal:     Palpations: Abdomen is soft.     Tenderness: There is no abdominal tenderness.  Musculoskeletal:        General: No swelling.     Cervical back: Neck supple.  Skin:    General: Skin is warm and dry.     Capillary Refill: Capillary refill takes less than 2 seconds.  Neurological:     General: No focal deficit present.     Mental Status: She is alert and oriented to person, place, and time.  Psychiatric:        Mood and Affect: Mood normal.     ED Results / Procedures /  Treatments   Labs (all labs ordered are listed, but only abnormal results are displayed) Labs Reviewed  COMPREHENSIVE METABOLIC PANEL - Abnormal; Notable for the following components:      Result Value   Sodium 132 (*)    Potassium 2.8 (*)    Chloride 96 (*)    Glucose, Bld 128 (*)    BUN 24 (*)    Creatinine, Ser 1.26 (*)    Calcium 7.9 (*)    Total Protein 6.4 (*)    GFR, Estimated 53 (*)    All other components within normal limits  URINALYSIS, ROUTINE W REFLEX MICROSCOPIC - Abnormal; Notable for the following components:   Protein, ur 30 (*)    All other components within normal limits  MAGNESIUM - Abnormal; Notable for the following components:   Magnesium 1.5 (*)    All other components within normal limits  URINALYSIS, MICROSCOPIC (REFLEX) - Abnormal; Notable for the following components:   Bacteria, UA RARE (*)    All other components within normal limits  LIPASE, BLOOD  CBC    EKG EKG Interpretation  Date/Time:  Monday May 28 2022 13:22:33 EDT Ventricular Rate:  89 PR Interval:  148 QRS Duration: 96 QT Interval:  387 QTC Calculation: 471 R Axis:   81 Text Interpretation: Sinus rhythm Nonspecific T abnormalities, anterior leads No old tracing to compare Confirmed by Pricilla LovelessGoldston, Scott [phone]) on 05/28/2022 1:43:01 PM  Radiology No results found.  Procedures Procedures    Medications Ordered in ED Medications  0.9 %  sodium chloride infusion (has no administration in time range)  sodium chloride 0.9 % bolus 1,000 mL (0 mLs Intravenous Stopped 05/28/22 1625)  ondansetron (ZOFRAN) injection 4 mg (4 mg Intravenous Given 05/28/22 1345)  potassium chloride SA (KLOR-CON M) CR tablet 40 mEq (40 mEq Oral Given 05/28/22 1423)  potassium chloride 10 mEq in 100 mL IVPB (0 mEq Intravenous Stopped 05/28/22 1643)  calcium carbonate (TUMS - dosed in mg elemental calcium) chewable tablet 200 mg of elemental calcium (200 mg of elemental calcium Oral Given 05/28/22 1436)  magnesium  sulfate IVPB 2 g 50 mL (0 g Intravenous Stopped 05/28/22 1625)    ED Course/ Medical Decision Making/ A&P Clinical Course as of 05/28/22 1848  Mon May 28, 2022  1416 Calcium(!): 7.9 [CB]    Clinical Course User Index [CB] Ma RingsBeatty, Agnes Probert A, PA-C                             Medical Decision Making This patient presents to the ED for  concern of vomiting and diarrhea, syncope and hand spasms, this involves an extensive number of treatment options, and is a complaint that carries with it a high risk of complications and morbidity.  The differential diagnosis includes gastroenteritis, dehydration, electrolyte abnormality, arrhythmia, other   Co morbidities that complicate the patient evaluation  History of vaginal cancer now in remission   Additional history obtained:  Additional history obtained from EMR External records from outside source obtained and reviewed including prior GYN notes, previous labs   Lab Tests:  I Ordered, and personally interpreted labs.  The pertinent results include: CBC is reassuring with no leukocytosis or anemia, CMP significant for hyponatremia, hypokalemia, hypocalcemia, mild increase in BUN and creatinine from baseline     Cardiac Monitoring: / EKG:  The patient was maintained on a cardiac monitor.  I personally viewed and interpreted the cardiac monitored which showed an underlying rhythm of: Sinus rhythm, nonspecific anterior T wave changes on ECG     Problem List / ED Course / Critical interventions / Medication management  Nausea vomiting diarrhea today, got a bath because she was feeling very cold and when she stood up she had a syncopal episode, woke up with carpopedal spasms that lasted for about 15 minutes, she is feeling better now but came to the ER for concern.  Denies head injury or loss consciousness, no headache, no numbness tingling or weakness at this time.  She is not on any blood thinners, no other past medical history besides  remote history of vaginal cancer that is in remission  Labs showed low magnesium, low potassium, low calcium low calcium corrected to 8.2.  These were all repleted, she was given IV fluids.  She is able to orally rehydrate and says she is feeling much better though is tired from being up early and vomiting.  We discussed intake of adequate electrolytes at home, follow-up close with primary care doctor and strict return precautions. I ordered medication including Zofran, IV fluids, potassium magnesium and calcium replacement for nausea vomiting and electrolyte abnormalities Reevaluation of the patient after these medicines showed that the patient resolved I have reviewed the patients home medicines and have made adjustments as needed     Amount and/or Complexity of Data Reviewed Labs: ordered. Decision-making details documented in ED Course.  Risk OTC drugs. Prescription drug management.           Final Clinical Impression(s) / ED Diagnoses Final diagnoses:  Dehydration  Nausea vomiting and diarrhea  Hypokalemia  Low magnesium level    Rx / DC Orders ED Discharge Orders          Ordered    potassium chloride (KLOR-CON) 10 MEQ tablet  Daily        05/28/22 1746              Ma RingsBeatty, Andrianna Manalang A, PA-C 05/28/22 Raymondo [id number]    Goldston, Scott, MD 06/04/22 1710

## 2022-05-28 NOTE — ED Triage Notes (Signed)
Pt states N/V/D that started at 0400, states had chills, reports syncopal episode getting out of tub, "states caught herself" reports couldn't move fingers at time of episode but now has full movement  Tolerating po intake as of now

## 2022-06-01 DIAGNOSIS — E876 Hypokalemia: Secondary | ICD-10-CM | POA: Diagnosis not present

## 2022-06-01 DIAGNOSIS — E871 Hypo-osmolality and hyponatremia: Secondary | ICD-10-CM | POA: Diagnosis not present

## 2022-06-01 DIAGNOSIS — N179 Acute kidney failure, unspecified: Secondary | ICD-10-CM | POA: Diagnosis not present

## 2022-06-06 DIAGNOSIS — R1084 Generalized abdominal pain: Secondary | ICD-10-CM | POA: Diagnosis not present

## 2022-06-06 DIAGNOSIS — R42 Dizziness and giddiness: Secondary | ICD-10-CM | POA: Diagnosis not present

## 2022-09-01 DIAGNOSIS — M25521 Pain in right elbow: Secondary | ICD-10-CM | POA: Diagnosis not present

## 2022-11-05 DIAGNOSIS — M7711 Lateral epicondylitis, right elbow: Secondary | ICD-10-CM | POA: Diagnosis not present

## 2022-11-22 ENCOUNTER — Ambulatory Visit: Payer: Self-pay | Admitting: Podiatry

## 2022-11-22 DIAGNOSIS — C52 Malignant neoplasm of vagina: Secondary | ICD-10-CM | POA: Diagnosis not present

## 2022-11-29 ENCOUNTER — Ambulatory Visit: Payer: Self-pay | Admitting: Podiatry

## 2022-12-28 DIAGNOSIS — Z0189 Encounter for other specified special examinations: Secondary | ICD-10-CM | POA: Diagnosis not present

## 2022-12-28 DIAGNOSIS — Z0000 Encounter for general adult medical examination without abnormal findings: Secondary | ICD-10-CM | POA: Diagnosis not present

## 2023-01-04 DIAGNOSIS — Z1339 Encounter for screening examination for other mental health and behavioral disorders: Secondary | ICD-10-CM | POA: Diagnosis not present

## 2023-01-04 DIAGNOSIS — Z1331 Encounter for screening for depression: Secondary | ICD-10-CM | POA: Diagnosis not present

## 2023-01-04 DIAGNOSIS — Z23 Encounter for immunization: Secondary | ICD-10-CM | POA: Diagnosis not present

## 2023-01-04 DIAGNOSIS — R82998 Other abnormal findings in urine: Secondary | ICD-10-CM | POA: Diagnosis not present

## 2023-01-04 DIAGNOSIS — N1831 Chronic kidney disease, stage 3a: Secondary | ICD-10-CM | POA: Diagnosis not present

## 2023-01-04 DIAGNOSIS — Z0000 Encounter for general adult medical examination without abnormal findings: Secondary | ICD-10-CM | POA: Diagnosis not present

## 2023-01-12 DIAGNOSIS — M2550 Pain in unspecified joint: Secondary | ICD-10-CM | POA: Diagnosis not present

## 2023-01-12 DIAGNOSIS — M5431 Sciatica, right side: Secondary | ICD-10-CM | POA: Diagnosis not present

## 2023-01-12 DIAGNOSIS — M5432 Sciatica, left side: Secondary | ICD-10-CM | POA: Diagnosis not present

## 2023-01-12 DIAGNOSIS — M545 Low back pain, unspecified: Secondary | ICD-10-CM | POA: Diagnosis not present

## 2023-01-25 DIAGNOSIS — H01004 Unspecified blepharitis left upper eyelid: Secondary | ICD-10-CM | POA: Diagnosis not present

## 2023-03-05 ENCOUNTER — Other Ambulatory Visit: Payer: Self-pay | Admitting: Internal Medicine

## 2023-03-05 DIAGNOSIS — Z1231 Encounter for screening mammogram for malignant neoplasm of breast: Secondary | ICD-10-CM

## 2023-03-28 ENCOUNTER — Ambulatory Visit
Admission: RE | Admit: 2023-03-28 | Discharge: 2023-03-28 | Disposition: A | Discharge status: Home / Self Care | Payer: BC Managed Care – PPO | Source: Ambulatory Visit | Attending: Internal Medicine | Admitting: Internal Medicine

## 2023-03-28 DIAGNOSIS — Z1231 Encounter for screening mammogram for malignant neoplasm of breast: Secondary | ICD-10-CM | POA: Diagnosis not present

## 2023-07-24 DIAGNOSIS — N951 Menopausal and female climacteric states: Secondary | ICD-10-CM | POA: Diagnosis not present

## 2023-07-24 DIAGNOSIS — F520 Hypoactive sexual desire disorder: Secondary | ICD-10-CM | POA: Diagnosis not present

## 2023-11-21 DIAGNOSIS — C52 Malignant neoplasm of vagina: Secondary | ICD-10-CM | POA: Diagnosis not present

## 2024-01-06 DIAGNOSIS — N951 Menopausal and female climacteric states: Secondary | ICD-10-CM | POA: Diagnosis not present

## 2024-02-03 DIAGNOSIS — R82998 Other abnormal findings in urine: Secondary | ICD-10-CM | POA: Diagnosis not present

## 2024-02-04 DIAGNOSIS — F4322 Adjustment disorder with anxiety: Secondary | ICD-10-CM | POA: Diagnosis not present
